# Patient Record
Sex: Female | Born: 1980
Health system: Southern US, Community
[De-identification: ages and names within clinical notes are randomized; demographics above are authoritative.]

## PROBLEM LIST (undated history)

## (undated) DIAGNOSIS — F32A Depression, unspecified: Secondary | ICD-10-CM

## (undated) DIAGNOSIS — F988 Other specified behavioral and emotional disorders with onset usually occurring in childhood and adolescence: Secondary | ICD-10-CM

## (undated) DIAGNOSIS — F329 Major depressive disorder, single episode, unspecified: Secondary | ICD-10-CM

## (undated) DIAGNOSIS — F419 Anxiety disorder, unspecified: Secondary | ICD-10-CM

## (undated) DIAGNOSIS — F1010 Alcohol abuse, uncomplicated: Secondary | ICD-10-CM

## (undated) DIAGNOSIS — D649 Anemia, unspecified: Secondary | ICD-10-CM

## (undated) DIAGNOSIS — F3281 Premenstrual dysphoric disorder: Secondary | ICD-10-CM

## (undated) HISTORY — DX: Major depressive disorder, single episode, unspecified: F32.9

## (undated) HISTORY — DX: Depression, unspecified: F32.A

## (undated) HISTORY — DX: Anxiety disorder, unspecified: F41.9

## (undated) HISTORY — PX: KNEE SURGERY: SHX244

## (undated) HISTORY — DX: Anemia, unspecified: D64.9

---
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
## 2000-01-19 ENCOUNTER — Encounter: Admission: RE | Admit: 2000-01-19 | Discharge: 2000-01-19 | Payer: Self-pay | Admitting: Family Medicine

## 2002-02-22 ENCOUNTER — Ambulatory Visit (HOSPITAL_COMMUNITY): Admission: RE | Admit: 2002-02-22 | Discharge: 2002-02-22 | Payer: Self-pay | Admitting: Family Medicine

## 2002-02-22 ENCOUNTER — Encounter: Payer: Self-pay | Admitting: Family Medicine

## 2003-09-10 ENCOUNTER — Encounter: Admission: RE | Admit: 2003-09-10 | Discharge: 2003-09-10 | Payer: Self-pay | Admitting: Psychiatry

## 2004-12-28 ENCOUNTER — Other Ambulatory Visit: Admission: RE | Admit: 2004-12-28 | Discharge: 2004-12-28 | Payer: Self-pay | Admitting: Obstetrics and Gynecology

## 2005-05-27 ENCOUNTER — Ambulatory Visit: Payer: Self-pay | Admitting: Psychiatry

## 2005-05-27 ENCOUNTER — Ambulatory Visit (HOSPITAL_COMMUNITY): Payer: Self-pay | Admitting: Psychiatry

## 2005-10-17 ENCOUNTER — Ambulatory Visit (HOSPITAL_COMMUNITY): Payer: Self-pay | Admitting: Psychiatry

## 2005-12-08 ENCOUNTER — Ambulatory Visit (HOSPITAL_COMMUNITY): Payer: Self-pay | Admitting: Psychiatry

## 2005-12-08 ENCOUNTER — Other Ambulatory Visit: Admission: RE | Admit: 2005-12-08 | Discharge: 2005-12-08 | Payer: Self-pay | Admitting: Obstetrics and Gynecology

## 2006-03-06 ENCOUNTER — Ambulatory Visit (HOSPITAL_COMMUNITY): Payer: Self-pay | Admitting: Psychiatry

## 2006-05-19 ENCOUNTER — Ambulatory Visit (HOSPITAL_COMMUNITY): Payer: Self-pay | Admitting: Psychiatry

## 2006-07-27 ENCOUNTER — Ambulatory Visit (HOSPITAL_COMMUNITY): Payer: Self-pay | Admitting: Psychiatry

## 2006-09-29 ENCOUNTER — Ambulatory Visit (HOSPITAL_COMMUNITY): Payer: Self-pay | Admitting: Psychiatry

## 2006-12-26 ENCOUNTER — Ambulatory Visit (HOSPITAL_COMMUNITY): Payer: Self-pay | Admitting: Psychiatry

## 2007-03-06 ENCOUNTER — Ambulatory Visit (HOSPITAL_COMMUNITY): Payer: Self-pay | Admitting: Psychiatry

## 2007-05-29 ENCOUNTER — Ambulatory Visit (HOSPITAL_COMMUNITY): Payer: Self-pay | Admitting: Psychiatry

## 2007-08-06 ENCOUNTER — Ambulatory Visit (HOSPITAL_COMMUNITY): Payer: Self-pay | Admitting: Psychiatry

## 2007-10-02 ENCOUNTER — Ambulatory Visit (HOSPITAL_COMMUNITY): Payer: Self-pay | Admitting: Psychiatry

## 2007-12-27 ENCOUNTER — Ambulatory Visit (HOSPITAL_COMMUNITY): Payer: Self-pay | Admitting: Psychiatry

## 2008-03-04 ENCOUNTER — Ambulatory Visit (HOSPITAL_COMMUNITY): Payer: Self-pay | Admitting: Psychiatry

## 2008-04-28 ENCOUNTER — Ambulatory Visit (HOSPITAL_COMMUNITY): Payer: Self-pay | Admitting: Psychiatry

## 2008-12-10 ENCOUNTER — Ambulatory Visit (HOSPITAL_COMMUNITY): Payer: Self-pay | Admitting: Psychiatry

## 2009-03-06 ENCOUNTER — Ambulatory Visit (HOSPITAL_COMMUNITY): Payer: Self-pay | Admitting: Psychiatry

## 2009-05-06 ENCOUNTER — Ambulatory Visit (HOSPITAL_COMMUNITY): Payer: Self-pay | Admitting: Psychiatry

## 2013-05-07 ENCOUNTER — Ambulatory Visit (INDEPENDENT_AMBULATORY_CARE_PROVIDER_SITE_OTHER): Payer: BC Managed Care – PPO | Admitting: Internal Medicine

## 2013-05-07 VITALS — BP 124/76 | HR 120 | Temp 99.0°F | Resp 16 | Ht 62.5 in | Wt 122.0 lb

## 2013-05-07 DIAGNOSIS — F341 Dysthymic disorder: Secondary | ICD-10-CM

## 2013-05-07 DIAGNOSIS — F988 Other specified behavioral and emotional disorders with onset usually occurring in childhood and adolescence: Secondary | ICD-10-CM

## 2013-05-07 DIAGNOSIS — F329 Major depressive disorder, single episode, unspecified: Secondary | ICD-10-CM

## 2013-05-07 MED ORDER — AMPHETAMINE-DEXTROAMPHETAMINE 30 MG PO TABS: 30.0000 mg | ORAL_TABLET | Freq: Two times a day (BID) | ORAL | Status: DC

## 2013-05-07 MED ORDER — BUPROPION HCL ER (XL) 300 MG PO TB24: 300.0000 mg | ORAL_TABLET | Freq: Every day | ORAL | Status: DC

## 2013-05-08 ENCOUNTER — Encounter: Payer: Self-pay | Admitting: Internal Medicine

## 2013-05-08 DIAGNOSIS — F329 Major depressive disorder, single episode, unspecified: Secondary | ICD-10-CM | POA: Insufficient documentation

## 2013-05-08 DIAGNOSIS — F902 Attention-deficit hyperactivity disorder, combined type: Secondary | ICD-10-CM | POA: Insufficient documentation

## 2013-05-08 NOTE — Progress Notes (Signed)
  Subjective:    Patient ID: Taylor [name], female    DOB: 1981/03/26, 32 y.o.   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 Spending the rainy season here Problem #1 attention deficit disorder  Does well with Adderall 30 mg in morning and 20 mg in the afternoon for a long time Problem #2 reactive depression following a significant relationship breakup 3-4 years ago  Breast well to Wellbutrin and is reluctant to discontinue it Problem #3 insomnia-occasional Xanax very rare because doing so well  pmh= 4 knee surgeries on the same knee for sports-related problems  Yaz  Parents divorce  FH 2 sibs    Review of Systems All stable except recent episode of fever and diarrhea with nausea and vomiting before she left [place]/this has improved and she feels like she is getting well although still has loose stools Rest of review systems is negative    Objective:   Physical Exam BP 124/76  Pulse 120  Temp(Src) 99 F (37.2 C) (Oral)  Resp 16  Ht 5' 2.5" (1.588 m)  Wt 122 lb (55.339 kg)  BMI 21.94 kg/m2  SpO2 98% Exam is normal Mood good/affect appropriate even unbelievable for what she faces in [place]      Assessment & Plan:  Problem #1 attention deficit disorder Problem 2 reactive depression  Meds ordered this encounter  Medications  . amphetamine-dextroamphetamine (ADDERALL) 30 MG tablet    Sig: Take 1 tablet (30 mg total) by mouth 2 (two) times daily.    Dispense:  60 tablet    Refill:  0  . amphetamine-dextroamphetamine (ADDERALL) 30 MG tablet    Sig: Take 1 tablet (30 mg total) by mouth 2 (two) times daily. 06/07/13    Dispense:  60 tablet    Refill:  0  . amphetamine-dextroamphetamine (ADDERALL) 30 MG tablet    Sig: Take 1 tablet (30 mg total) by mouth 2 (two) times daily. 07/07/13    Dispense:  60  tablet    Refill:  0  . buPROPion (WELLBUTRIN XL) 300 MG 24 hr tablet    Sig: Take 1 tablet (300 mg total) by mouth daily.    Dispense:  90 tablet    Refill:  3   She will call over the next 10 days if diarrhea persists and if she develops fever again I would suggest impaired treatment with Flagyl first and then stool testing only if not responsive  She may call in 3 months for refills and followup in 6 months We may have to rearrange her scheduling due to international commitments  May call she needs refills of Xanax for insomnia, but is currently trying to discontinue this

## 2013-07-31 ENCOUNTER — Encounter: Payer: Self-pay | Admitting: Internal Medicine

## 2013-08-02 MED ORDER — BUPROPION HCL ER (XL) 300 MG PO TB24: 300.0000 mg | ORAL_TABLET | Freq: Every day | ORAL | Status: DC

## 2013-08-02 MED ORDER — AMPHETAMINE-DEXTROAMPHETAMINE 30 MG PO TABS: 30.0000 mg | ORAL_TABLET | Freq: Two times a day (BID) | ORAL | Status: DC

## 2013-08-02 NOTE — Telephone Encounter (Signed)
  Meds ordered this encounter  Medications  . amphetamine-dextroamphetamine (ADDERALL) 30 MG tablet    Sig: Take 1 tablet (30 mg total) by mouth 2 (two) times daily. 07/07/13    Dispense:  60 tablet    Refill:  0  . amphetamine-dextroamphetamine (ADDERALL) 30 MG tablet    Sig: Take 1 tablet (30 mg total) by mouth 2 (two) times daily.    Dispense:  60 tablet    Refill:  0  . amphetamine-dextroamphetamine (ADDERALL) 30 MG tablet    Sig: Take 1 tablet (30 mg total) by mouth 2 (two) times daily. 06/07/13    Dispense:  60 tablet    Refill:  0  . buPROPion (WELLBUTRIN XL) 300 MG 24 hr tablet    Sig: Take 1 tablet (300 mg total) by mouth daily.    Dispense:  90 tablet    Refill:  3  Will change dates

## 2013-10-15 ENCOUNTER — Ambulatory Visit (INDEPENDENT_AMBULATORY_CARE_PROVIDER_SITE_OTHER): Payer: BC Managed Care – PPO | Admitting: Internal Medicine

## 2013-10-15 VITALS — BP 122/90 | HR 133 | Temp 98.5°F | Resp 18 | Ht 62.0 in | Wt 112.4 lb

## 2013-10-15 DIAGNOSIS — F988 Other specified behavioral and emotional disorders with onset usually occurring in childhood and adolescence: Secondary | ICD-10-CM

## 2013-10-15 DIAGNOSIS — F3281 Premenstrual dysphoric disorder: Secondary | ICD-10-CM

## 2013-10-15 DIAGNOSIS — N943 Premenstrual tension syndrome: Secondary | ICD-10-CM

## 2013-10-15 MED ORDER — DROSPIRENONE-ETHINYL ESTRADIOL 3-0.02 MG PO TABS: 1.0000 | ORAL_TABLET | Freq: Every day | ORAL | Status: DC

## 2013-10-15 MED ORDER — AMPHETAMINE-DEXTROAMPHETAMINE 30 MG PO TABS: 30.0000 mg | ORAL_TABLET | Freq: Two times a day (BID) | ORAL | Status: DC

## 2013-10-15 NOTE — Progress Notes (Signed)
This chart was scribed for Tonye Pearson, MD by Caryn Bee, Medical Scribe. This patient was seen in Room/bed 3 and the patient's care was started at 2:24 PM.  Subjective:    Patient ID: Taylor [name], female    DOB: 1981-12-05, 32 y.o.   MRN: [id number]  HPI HPI Comments: Taylor [name] is a 32 y.o. female who presents to [facility] for prescription 30 mg Adderall refill.  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  Pt will also need refill of Yaz. She takes it continuously for PMDD and has periods 2- 4 times per year.   Review of Systems Past Medical History  Diagnosis Date  . Anxiety   . Anemia   . Depression         Social History Main Topics  . Smoking status: Never Smoker   . Smokeless tobacco: Not on file  . Alcohol Use: 3.0 oz/week  . Drug Use: No  . Sexual Activity: Yes    Birth Control/ Protection: Pill       Objective:   Physical Exam  Nursing note and vitals reviewed. Constitutional: She is oriented to person, place, and time. She appears well-developed and well-nourished. No distress.  HENT:  Head: Normocephalic and atraumatic.  Eyes: Pupils are equal, round, and reactive to light.  Neck: Normal range of motion.  Cardiovascular: Normal rate and regular rhythm.   Pulmonary/Chest: Effort normal. No respiratory distress.  Musculoskeletal: Normal range of motion.  Neurological: She is alert and oriented to person, place, and time.  Skin: Skin is warm and dry.  Psychiatric: She has a normal mood and affect. Her behavior is normal.  BP 122/90  Pulse 133  Temp(Src) 98.5 F (36.9 C) (Oral)  Resp 18  Ht 5\' 2"  (1.575 m)  Wt 112 lb 6.4 oz (50.984 kg)  BMI 20.55 kg/m2  SpO2 100%     Assessment & Plan:  Attention deficit disorder Reactive depression PMDD  Meds ordered this encounter  Medications  . amphetamine-dextroamphetamine (ADDERALL) 30 MG tablet    Sig: Take 1 tablet (30 mg total) by  mouth 2 (two) times daily. 06/07/13    Dispense:  60 tablet    Refill:  0  . amphetamine-dextroamphetamine (ADDERALL) 30 MG tablet    Sig: Take 1 tablet (30 mg total) by mouth 2 (two) times daily. 07/07/13    Dispense:  60 tablet    Refill:  0  . amphetamine-dextroamphetamine (ADDERALL) 30 MG tablet    Sig: Take 1 tablet (30 mg total) by mouth 2 (two) times daily.    Dispense:  60 tablet    Refill:  0  . drospirenone-ethinyl estradiol (YAZ,GIANVI,LORYNA) 3-0.02 MG tablet    Sig: Take 1 tablet by mouth daily. Take active pills continuously for 6 months before allowing menses    Dispense:  3 Package    Refill:  3

## 2013-10-16 DIAGNOSIS — F3281 Premenstrual dysphoric disorder: Secondary | ICD-10-CM | POA: Insufficient documentation

## 2013-10-24 ENCOUNTER — Other Ambulatory Visit: Payer: Self-pay

## 2013-11-07 ENCOUNTER — Telehealth: Payer: Self-pay

## 2013-11-07 NOTE — Telephone Encounter (Signed)
Patient lost her Adderrall scripts. They are written out for 3 months. Can she have those re-printed?  [phone]

## 2013-11-08 MED ORDER — AMPHETAMINE-DEXTROAMPHETAMINE 30 MG PO TABS: 30.0000 mg | ORAL_TABLET | Freq: Two times a day (BID) | ORAL | Status: DC

## 2013-12-10 ENCOUNTER — Telehealth: Payer: Self-pay | Admitting: Radiology

## 2013-12-10 NOTE — Telephone Encounter (Signed)
Patient lost her Rx's in written in October for November / December, these were replaced, placed in pick up drawer. Now they are not in pick up drawer, patient states she did not get them, will you rewrite, again? We may need to check data base first.

## 2013-12-11 MED ORDER — AMPHETAMINE-DEXTROAMPHETAMINE 30 MG PO TABS: 30.0000 mg | ORAL_TABLET | Freq: Two times a day (BID) | ORAL | Status: DC

## 2013-12-11 NOTE — Telephone Encounter (Signed)
To repl lost paper Meds ordered this encounter  Medications  . amphetamine-dextroamphetamine (ADDERALL) 30 MG tablet    Sig: Take 1 tablet (30 mg total) by mouth 2 (two) times daily.    Dispense:  60 tablet    Refill:  0  . amphetamine-dextroamphetamine (ADDERALL) 30 MG tablet    Sig: Take 1 tablet (30 mg total) by mouth 2 (two) times daily. For 30 days after signing date    Dispense:  60 tablet    Refill:  0

## 2013-12-11 NOTE — Telephone Encounter (Signed)
Pt.notified

## 2014-01-28 ENCOUNTER — Encounter: Payer: Self-pay | Admitting: Internal Medicine

## 2014-01-29 MED ORDER — AMPHETAMINE-DEXTROAMPHETAMINE 30 MG PO TABS: 30.0000 mg | ORAL_TABLET | Freq: Two times a day (BID) | ORAL | Status: DC

## 2014-01-29 NOTE — Telephone Encounter (Signed)
Meds ordered this encounter  Medications  . amphetamine-dextroamphetamine (ADDERALL) 30 MG tablet    Sig: Take 1 tablet (30 mg total) by mouth 2 (two) times daily. For 30 days after signing date    Dispense:  60 tablet    Refill:  0  . amphetamine-dextroamphetamine (ADDERALL) 30 MG tablet    Sig: Take 1 tablet (30 mg total) by mouth 2 (two) times daily.    Dispense:  60 tablet    Refill:  0  . amphetamine-dextroamphetamine (ADDERALL) 30 MG tablet    Sig: Take 1 tablet (30 mg total) by mouth 2 (two) times daily. For 60 days after signing date    Dispense:  60 tablet    Refill:  0

## 2014-03-01 ENCOUNTER — Encounter: Payer: Self-pay | Admitting: Internal Medicine

## 2014-03-01 NOTE — Telephone Encounter (Signed)
I don't see any telephone messages or anything about this. Have you heard anything about her dropping this off?

## 2014-03-05 NOTE — Telephone Encounter (Signed)
i haven't heard anything--this should be easy to complete

## 2014-03-20 ENCOUNTER — Other Ambulatory Visit: Payer: Self-pay

## 2014-03-20 MED ORDER — AMPHETAMINE-DEXTROAMPHETAMINE 30 MG PO TABS: 30.0000 mg | ORAL_TABLET | Freq: Two times a day (BID) | ORAL | Status: DC

## 2014-03-20 NOTE — Telephone Encounter (Signed)
I called pt to check to see if this Rx had ever been rewritten. EPIC notes showed Taylor [name] had sent message to PA pool, but I didn't see any new Rx printed or any record in Log Book that pt had p/up. Pt reported that she had been out of town and just got back a couple of days ago. She has not gotten a new copy of Rx orig written for "30 days after day written" but would appreciate one. She does still have the one for 60 days after, but can't get it filled yet. I never saw the orig that was dropped off here so not sure if it was put in Dr Doolittle's box?

## 2014-03-21 NOTE — Telephone Encounter (Signed)
Notified pt Rx ready for p/up and transferred to 104 to sch appt.

## 2014-04-21 ENCOUNTER — Ambulatory Visit (INDEPENDENT_AMBULATORY_CARE_PROVIDER_SITE_OTHER): Payer: BC Managed Care – PPO | Admitting: Internal Medicine

## 2014-04-21 VITALS — BP 124/76 | HR 104 | Temp 99.0°F | Resp 17 | Ht 63.0 in | Wt 122.0 lb

## 2014-04-21 DIAGNOSIS — F988 Other specified behavioral and emotional disorders with onset usually occurring in childhood and adolescence: Secondary | ICD-10-CM

## 2014-04-21 MED ORDER — AMPHETAMINE-DEXTROAMPHETAMINE 30 MG PO TABS: 30.0000 mg | ORAL_TABLET | Freq: Two times a day (BID) | ORAL | Status: DC

## 2014-04-21 NOTE — Progress Notes (Signed)
Patient Active Problem List   Diagnosis Date Noted  . PMDD (premenstrual dysphoric disorder) 10/16/2013  . ADD (attention deficit disorder) 05/08/2013  . 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 amphetamine-dextroamphetamine (ADDERALL) 30 MG tablet    Sig: Take 1 tablet (30 mg total) by mouth 2 (two) times daily. Needs 3 month supply for being in [place] for next 3 months    Dispense:  180 tablet    Refill:  0   Recheck in August Consider halcion if medications needed for waking early and being unable to sleep

## 2014-05-28 ENCOUNTER — Telehealth: Payer: Self-pay

## 2014-05-28 MED ORDER — BUPROPION HCL ER (XL) 300 MG PO TB24: 300.0000 mg | ORAL_TABLET | Freq: Every day | ORAL | Status: DC

## 2014-05-28 MED ORDER — DROSPIRENONE-ETHINYL ESTRADIOL 3-0.02 MG PO TABS: 1.0000 | ORAL_TABLET | Freq: Every day | ORAL | Status: DC

## 2014-05-28 NOTE — Telephone Encounter (Signed)
Lm for mother- wellbutrin and [facility] sent to the pharmacy.  We have to wait for authorization from Dr. Merla Riches for the Adderall.

## 2014-05-28 NOTE — Telephone Encounter (Signed)
Can this be approved for the patient? She needs enough to last through the end of August. 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

## 2014-05-28 NOTE — Telephone Encounter (Signed)
Wellbutrin and OCP sent to pharmacy.  Please ask a physician to write Rx for Adderall.  I cannot write for more than 30 days which will not be sufficient as she does not return until August

## 2014-05-28 NOTE — Telephone Encounter (Signed)
Please forward this to Dr. Merla Riches. This is his regular patient we certainly can go ahead and refill the Wellbutrin and also the birth control pills. We need to get approval from Dr. Merla Riches regarding Adderall
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
## 2014-05-28 NOTE — Telephone Encounter (Signed)
Taylor [name]'s mother called in and would like Dr. Merla Riches to contact her about her daughter who is in [place]. She can be reached at [phone] Thank you

## 2014-05-29 NOTE — Telephone Encounter (Signed)
Please call pt when completed.

## 2014-05-29 NOTE — Telephone Encounter (Signed)
Ok to give 3 month supply---I can do this Friday am

## 2014-05-30 MED ORDER — AMPHETAMINE-DEXTROAMPHETAMINE 30 MG PO TABS: 30.0000 mg | ORAL_TABLET | Freq: Two times a day (BID) | ORAL | Status: DC

## 2014-05-30 NOTE — Telephone Encounter (Signed)
LMOM that rx is ready for p/u 
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
## 2014-06-03 ENCOUNTER — Other Ambulatory Visit: Payer: Self-pay | Admitting: *Deleted

## 2014-06-03 MED ORDER — BUPROPION HCL ER (XL) 300 MG PO TB24: 300.0000 mg | ORAL_TABLET | Freq: Every day | ORAL | Status: DC

## 2014-07-28 ENCOUNTER — Encounter: Payer: Self-pay | Admitting: Internal Medicine

## 2014-07-29 MED ORDER — AMPHETAMINE-DEXTROAMPHETAMINE 30 MG PO TABS: 30.0000 mg | ORAL_TABLET | Freq: Two times a day (BID) | ORAL | Status: DC

## 2014-07-29 NOTE — Telephone Encounter (Signed)
Meds ordered this encounter  Medications  . amphetamine-dextroamphetamine (ADDERALL) 30 MG tablet    Sig: Take 1 tablet (30 mg total) by mouth 2 (two) times daily.    Dispense:  60 tablet    Refill:  0  . amphetamine-dextroamphetamine (ADDERALL) 30 MG tablet    Sig: Take 1 tablet (30 mg total) by mouth 2 (two) times daily. For 60 days after signing date    Dispense:  60 tablet    Refill:  0  . amphetamine-dextroamphetamine (ADDERALL) 30 MG tablet    Sig: Take 1 tablet (30 mg total) by mouth 2 (two) times daily. For 30 days after signing date    Dispense:  60 tablet    Refill:  0

## 2014-09-01 ENCOUNTER — Telehealth: Payer: Self-pay

## 2014-09-01 MED ORDER — AMPHETAMINE-DEXTROAMPHETAMINE 30 MG PO TABS: 30.0000 mg | ORAL_TABLET | Freq: Two times a day (BID) | ORAL | Status: DC

## 2014-09-01 NOTE — Telephone Encounter (Signed)
I asked Debbie to address this since Chelle was w/a pt. Printed and taken to [employer] for signature.

## 2014-09-01 NOTE — Telephone Encounter (Signed)
Debbie signed Rxs and I called the 5 day emerg supply in and mailed Rxs.

## 2014-09-01 NOTE — Telephone Encounter (Signed)
Pt called from pharm in [place] stating that the script for Adderall written for 30 days after signing date is expired bc d/t [place] laws, class II controlled always expires 30 days after written date. She stated that we can mail a new script to them along with a separate script for an emergency 5 day supply. She can then take a verbal order for the 5 day supply and fill now while waiting on the written scripts. Chelle, may I do this in your name? Pended.

## 2014-09-05 ENCOUNTER — Other Ambulatory Visit: Payer: Self-pay | Admitting: *Deleted

## 2014-09-05 MED ORDER — BUPROPION HCL ER (XL) 300 MG PO TB24: ORAL_TABLET | ORAL | Status: DC

## 2014-09-05 NOTE — Telephone Encounter (Signed)
Received fax for refill request.  Pt needs OV. Sent #[phone].

## 2014-09-06 NOTE — Telephone Encounter (Signed)
The patient called and said that she spoke to the pharmacy about her Adderall prescription that was mailed from our office on 09/01/14.  The pharmacy denies receiving the prescription, so the patient said that she had to fill an emergency one-week refill with the pharmacy in the mean time.  She cannot get another emergency refill, and she said that she is completely out of her medication.  She is requesting that we resend the prescription through mail directly to her personal address.  The patient's current address is: [address] [place], [place] [address].  The patient requested that someone call her once it is mailed, so she knows that she should be looking out for it in the mail.  CB#: [phone].  Please advise.  Thank you.

## 2014-09-08 NOTE — Telephone Encounter (Signed)
I tried to call her and didn't get an answer. The prescription was mailed per [employer] [facility] It can take awhile for the mail to arrive because it is processed through [facility]. Please call her and see if she received her prescription. At this point, I don't think it would be helpful to send another.

## 2014-09-08 NOTE — Telephone Encounter (Signed)
Please advise 

## 2014-09-09 NOTE — Telephone Encounter (Signed)
Lm for pt to advise mail process can take a few weeks- asked her to rtn call.

## 2014-09-12 NOTE — Telephone Encounter (Signed)
Taylor Mccreedy do you happen to remember the address you mailed the rx's to? Pt is in [place]. Notice the address was not called to [place] until 5 days after it states it was mailed. Did this go to the pharmacy or her home address in [place]?

## 2014-09-12 NOTE — Telephone Encounter (Signed)
Rx was mailed to pt's pharmacy in [place] as inst'd by pharmacist. I advised pharmacist that Rx would take a while to get there since had to go through [facility].

## 2014-09-15 NOTE — Telephone Encounter (Signed)
Lm to advise pt it was mailed to her in [place]

## 2014-10-06 ENCOUNTER — Telehealth: Payer: Self-pay

## 2014-10-06 NOTE — Telephone Encounter (Signed)
Pt is needing refill on amphetamine-dextroamphetamine (ADDERALL) 30 MG tablet. Pt  Is requesting that it be mailed to [place]; [address] [place] [id number]. She had us do this for her previously. Please advise pt when script has been mailed.

## 2014-10-08 MED ORDER — AMPHETAMINE-DEXTROAMPHETAMINE 30 MG PO TABS: 30.0000 mg | ORAL_TABLET | Freq: Two times a day (BID) | ORAL | Status: DC

## 2014-10-08 NOTE — Telephone Encounter (Signed)
Meds ordered this encounter  Medications  . amphetamine-dextroamphetamine (ADDERALL) 30 MG tablet    Sig: Take 1 tablet by mouth 2 (two) times daily.    Dispense:  60 tablet    Refill:  0   Needs f/u in next month

## 2014-10-08 NOTE — Telephone Encounter (Signed)
Mailed, LMOM letting pt know.

## 2014-10-29 ENCOUNTER — Other Ambulatory Visit: Payer: Self-pay | Admitting: General Practice

## 2014-10-29 ENCOUNTER — Encounter: Payer: Self-pay | Admitting: Internal Medicine

## 2014-10-31 MED ORDER — AMPHETAMINE-DEXTROAMPHETAMINE 30 MG PO TABS: 30.0000 mg | ORAL_TABLET | Freq: Two times a day (BID) | ORAL | Status: DC

## 2014-10-31 MED ORDER — DROSPIRENONE-ETHINYL ESTRADIOL 3-0.02 MG PO TABS: 1.0000 | ORAL_TABLET | Freq: Every day | ORAL | Status: DC

## 2014-10-31 NOTE — Telephone Encounter (Signed)
Adderall printed and put in outgoing mail box for Mon pick up. Notified pt on MyChart.

## 2014-10-31 NOTE — Addendum Note (Signed)
Addended by: Sheppard PlumberBRIGGS, Hiro Vipond A on: 10/31/2014 04:37 PM   Modules accepted: Orders

## 2014-10-31 NOTE — Telephone Encounter (Signed)
Spoke to pt and verified her address and correct pharmacy info. Sent in her RFs of Yaz. Pending adderall Rxs to mail to her at address in this message.

## 2014-11-03 NOTE — Progress Notes (Signed)
I got this in mail on Friday, to go out Monday. Pt aware.

## 2014-12-14 ENCOUNTER — Ambulatory Visit (INDEPENDENT_AMBULATORY_CARE_PROVIDER_SITE_OTHER): Payer: Self-pay | Admitting: Internal Medicine

## 2014-12-14 VITALS — BP 128/80 | HR 102 | Temp 98.1°F | Resp 16 | Ht 63.0 in | Wt 117.4 lb

## 2014-12-14 DIAGNOSIS — F3281 Premenstrual dysphoric disorder: Secondary | ICD-10-CM

## 2014-12-14 DIAGNOSIS — F329 Major depressive disorder, single episode, unspecified: Secondary | ICD-10-CM

## 2014-12-14 DIAGNOSIS — F988 Other specified behavioral and emotional disorders with onset usually occurring in childhood and adolescence: Secondary | ICD-10-CM

## 2014-12-14 DIAGNOSIS — F341 Dysthymic disorder: Secondary | ICD-10-CM

## 2014-12-14 DIAGNOSIS — F909 Attention-deficit hyperactivity disorder, unspecified type: Secondary | ICD-10-CM

## 2014-12-14 DIAGNOSIS — N943 Premenstrual tension syndrome: Secondary | ICD-10-CM

## 2014-12-14 MED ORDER — AMPHETAMINE-DEXTROAMPHETAMINE 30 MG PO TABS: 30.0000 mg | ORAL_TABLET | Freq: Two times a day (BID) | ORAL | Status: DC

## 2014-12-14 MED ORDER — BUPROPION HCL ER (XL) 300 MG PO TB24: ORAL_TABLET | ORAL | Status: DC

## 2014-12-14 MED ORDER — DROSPIRENONE-ETHINYL ESTRADIOL 3-0.02 MG PO TABS: 1.0000 | ORAL_TABLET | Freq: Every day | ORAL | Status: DC

## 2014-12-14 NOTE — Progress Notes (Signed)
   Subjective:    Patient ID: Taylor [name], female    DOB: 01-Jun-1981, 33 y.o.   MRN: [id number]  HPIf/u Patient Active Problem List   Diagnosis Date Noted  . PMDD (premenstrual dysphoric disorder)--- controlled by 6 months consecutive oral contraceptives  10/16/2013  . ADD (attention deficit disorder)--- continues to do well at current dose of Adderall without side effects or problems  05/08/2013  . Depression, reactive--- very good response Wellbutrin  05/08/2013    -  GERD--- seldom has trouble anymore and if so responds easily to Maalox  History of sensitive stomach in general.  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 Home visiting her family. Describes no problems within the family structure at this point even though parents are divorced.  No current relationship/not currently sexually active.   Review of Systems No weight loss No fevers or night sweats No cough or shortness of breath No palpitations No GYN problems    Objective:   Physical Exam  Constitutional: She is oriented to person, place, and time. She appears well-developed and well-nourished. No distress.  HENT:  Head: Normocephalic and atraumatic.  Eyes: Pupils are equal, round, and reactive to light.  Neck: Normal range of motion.  Cardiovascular: Normal rate and regular rhythm.   Pulmonary/Chest: Effort normal. No respiratory distress.  Musculoskeletal: Normal range of motion.  Neurological: She is alert and oriented to person, place, and time.  Skin: Skin is warm and dry.  Psychiatric: She has a normal mood and affect. Her behavior is normal.  Nursing note and vitals reviewed.  BP 128/80 mmHg  Pulse 102  Temp(Src) 98.1 F (36.7 C) (Oral)  Resp 16  Ht 5\' 3"  (1.6 m)  Wt 117 lb 6.4 oz (53.252 kg)  BMI 20.80 kg/m2  SpO2 99%        Assessment &  Plan:  ADD (attention deficit disorder)  Depression, reactive  PMDD (premenstrual dysphoric disorder)  Meds ordered this encounter  Medications  . buPROPion (WELLBUTRIN XL) 300 MG 24 hr tablet    Sig: Take one tablet daily    Dispense:  90 tablet    Refill:  3  . drospirenone-ethinyl estradiol (YAZ,GIANVI,LORYNA) 3-0.02 MG tablet    Sig: Take 1 tablet by mouth daily. Take active pills continuously for 6 months before allowing menses    Dispense:  3 Package    Refill:  3  . amphetamine-dextroamphetamine (ADDERALL) 30 MG tablet    Sig: Take 1 tablet by mouth 2 (two) times daily. For 60 days after signing date    Dispense:  60 tablet    Refill:  0  . amphetamine-dextroamphetamine (ADDERALL) 30 MG tablet    Sig: Take 1 tablet by mouth 2 (two) times daily.    Dispense:  60 tablet    Refill:  0  . amphetamine-dextroamphetamine (ADDERALL) 30 MG tablet    Sig: Take 1 tablet by mouth 2 (two) times daily. For 30 days after signing date    Dispense:  60 tablet    Refill:  0   Call in 3 months in follow-up in 6 months She will most likely have to fill her medications in [place] as [place] has stringent pharmacy laws regarding control substances

## 2015-01-21 ENCOUNTER — Encounter: Payer: Self-pay | Admitting: Internal Medicine

## 2015-01-21 MED ORDER — AMPHETAMINE-DEXTROAMPHETAMINE 30 MG PO TABS: 30.0000 mg | ORAL_TABLET | Freq: Two times a day (BID) | ORAL | Status: DC

## 2015-02-28 ENCOUNTER — Encounter: Payer: Self-pay | Admitting: Internal Medicine

## 2015-03-10 ENCOUNTER — Telehealth: Payer: Self-pay

## 2015-03-10 MED ORDER — AMPHETAMINE-DEXTROAMPHETAMINE 30 MG PO TABS: 30.0000 mg | ORAL_TABLET | Freq: Two times a day (BID) | ORAL | Status: DC

## 2015-03-10 NOTE — Telephone Encounter (Signed)
Dr. Merla Richesoolittle,  Pt states that she left you a message on MyChart regarding her RX refill. She is in [place] and needs a RX for 3 months worth of adderall mailed to her. The address is: [address] [place], [place] [id number].

## 2015-03-10 NOTE — Telephone Encounter (Signed)
Dr. Merla Richesoolittle,  Pt states that she left you a message on MyChart regarding her RX refill. She is in [place] and needs a RX for 3 months worth of adderall mailed to her. The address is: [address] [place], [place] [id number].         Meds ordered this encounter  Medications  . amphetamine-dextroamphetamine (ADDERALL) 30 MG tablet    Sig: Take 1 tablet by mouth 2 (two) times daily. For 60 days after signing date    Dispense:  60 tablet    Refill:  0  . amphetamine-dextroamphetamine (ADDERALL) 30 MG tablet    Sig: Take 1 tablet by mouth 2 (two) times daily. For 30 days or after from the Date of Signing    Dispense:  60 tablet    Refill:  0  . amphetamine-dextroamphetamine (ADDERALL) 30 MG tablet    Sig: Take 1 tablet by mouth 2 (two) times daily.    Dispense:  60 tablet    Refill:  0

## 2015-03-11 NOTE — Telephone Encounter (Signed)
Rx mailed. Pt notified

## 2015-04-06 ENCOUNTER — Other Ambulatory Visit: Payer: Self-pay | Admitting: Internal Medicine

## 2015-04-06 ENCOUNTER — Telehealth: Payer: Self-pay

## 2015-04-06 MED ORDER — AMPHETAMINE-DEXTROAMPHETAMINE 30 MG PO TABS: 30.0000 mg | ORAL_TABLET | Freq: Two times a day (BID) | ORAL | Status: DC

## 2015-04-06 NOTE — Telephone Encounter (Signed)
I checked the return mail but I remember mailing this. What other options do we have?

## 2015-04-06 NOTE — Telephone Encounter (Signed)
Mail again---?track this time?

## 2015-04-06 NOTE — Progress Notes (Signed)
See phone message apparently the female did not get her her last 3 prescriptions written on 03/10/2015 to cover 3 months  We will try to mail to prescriptions to replace these Meds ordered this encounter  Medications  . amphetamine-dextroamphetamine (ADDERALL) 30 MG tablet    Sig: Take 1 tablet by mouth 2 (two) times daily.    Dispense:  60 tablet    Refill:  0  . amphetamine-dextroamphetamine (ADDERALL) 30 MG tablet    Sig: Take 1 tablet by mouth 2 (two) times daily. For 30 days or after from the Date of Signing    Dispense:  60 tablet    Refill:  0

## 2015-04-06 NOTE — Telephone Encounter (Signed)
Pt states she still hasn't received her ADDERALL 30 MG. Wanted to make sure it is [address] [place]  [id number] Please call pt at [phone] if questions

## 2015-04-06 NOTE — Telephone Encounter (Signed)
Spoke with pt, mailed Rx's to her house through certified mail. Gave envelope to Merchant[place]Julie.

## 2015-05-21 ENCOUNTER — Encounter: Payer: Self-pay | Admitting: Internal Medicine

## 2015-05-27 ENCOUNTER — Encounter: Payer: Self-pay | Admitting: Internal Medicine

## 2015-06-02 ENCOUNTER — Encounter: Payer: Self-pay | Admitting: Internal Medicine

## 2015-06-02 DIAGNOSIS — F988 Other specified behavioral and emotional disorders with onset usually occurring in childhood and adolescence: Secondary | ICD-10-CM

## 2015-06-04 NOTE — Telephone Encounter (Signed)
We just received the Rxs printed on 03/10/15 back in the mail, and they were marked undeliverable. I have shredded these.

## 2015-06-24 ENCOUNTER — Other Ambulatory Visit: Payer: Self-pay | Admitting: Internal Medicine

## 2015-06-30 ENCOUNTER — Telehealth: Payer: Self-pay | Admitting: *Deleted

## 2015-06-30 MED ORDER — AMPHETAMINE-DEXTROAMPHETAMINE 30 MG PO TABS: 30.0000 mg | ORAL_TABLET | Freq: Two times a day (BID) | ORAL | Status: DC

## 2015-06-30 NOTE — Telephone Encounter (Signed)
Spoke with mom Lurena JoinerRebecca (per patient) that prescription for Adderall is ready for pick-up

## 2015-06-30 NOTE — Telephone Encounter (Signed)
Adderall refilled to be sent to patient in [place]. Last rx's returned as undeliverable and shredded.

## 2015-07-30 ENCOUNTER — Telehealth: Payer: Self-pay

## 2015-07-30 DIAGNOSIS — F1010 Alcohol abuse, uncomplicated: Secondary | ICD-10-CM

## 2015-07-30 DIAGNOSIS — R569 Unspecified convulsions: Secondary | ICD-10-CM

## 2015-07-30 NOTE — Telephone Encounter (Signed)
Patient's father is calling to leave a message for Dr. Theotis Barrio. He states that the patient is not in great health at the moment and would he like a call back from Dr. Merla Riches. He also states they've been a patient of Dr. Merla Riches for 30 years and though he should pass some important information along. Please call Theron Arista! [phone]

## 2015-07-31 ENCOUNTER — Encounter: Payer: Self-pay | Admitting: Internal Medicine

## 2015-08-01 DIAGNOSIS — F1021 Alcohol dependence, in remission: Secondary | ICD-10-CM | POA: Insufficient documentation

## 2015-08-01 DIAGNOSIS — R569 Unspecified convulsions: Secondary | ICD-10-CM | POA: Insufficient documentation

## 2015-08-01 NOTE — Telephone Encounter (Signed)
Father called to report hospitalization in [place] after seizure(on wellbutr and adderall) thought linked to alcohol abuse which has been discovered recently--required high doses of ativan,5 d on vent,now seeing psych and AA. We agreed meds only thru psych from now on as her stories of sxt might not have been accurate in past

## 2016-02-09 ENCOUNTER — Ambulatory Visit (INDEPENDENT_AMBULATORY_CARE_PROVIDER_SITE_OTHER): Payer: Self-pay | Admitting: Internal Medicine

## 2016-02-09 VITALS — BP 140/80 | HR 105 | Temp 98.2°F | Resp 20 | Ht 62.0 in | Wt 132.4 lb

## 2016-02-09 DIAGNOSIS — F988 Other specified behavioral and emotional disorders with onset usually occurring in childhood and adolescence: Secondary | ICD-10-CM

## 2016-02-09 DIAGNOSIS — F909 Attention-deficit hyperactivity disorder, unspecified type: Secondary | ICD-10-CM

## 2016-02-09 MED ORDER — DROSPIRENONE-ETHINYL ESTRADIOL 3-0.02 MG PO TABS: ORAL_TABLET | ORAL | Status: DC

## 2016-02-09 MED ORDER — AMPHETAMINE-DEXTROAMPHETAMINE 30 MG PO TABS: 30.0000 mg | ORAL_TABLET | Freq: Two times a day (BID) | ORAL | Status: DC

## 2016-02-12 NOTE — Progress Notes (Signed)
F/u Patient Active Problem List   Diagnosis Date Noted  . Convulsions/seizures--hosp [facility] 07/2015--etoh!! 08/01/2015  . Alcohol abuse 08/01/2015  . PMDD (premenstrual dysphoric disorder) 10/16/2013  . ADD (attention deficit disorder) 05/08/2013  . Depression, reactive 05/08/2013   Last ov here 11/2014 She moved to [facility] and slowly fell into dysfunctional state--drinking too much, becoming isolated, and eventually had an "event" requiring hospitalization--this note from disc with dad 08/01/15: Father called to report hospitalization in [place] after seizure(on wellbutr and adderall) thought linked to alcohol abuse which has been discovered recently--required high doses of ativan,5 d on vent,now seeing psych and AA. 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 Much better-off etoh now with intown social and family life She wants to resume ADD meds now ///very easily distracted and feels that this causes procrastination and frustration at incomplete tasks(multiple lists that never get completed) Intervention in [place] did not result in any long term treatments-she says she was not happy with all the hassles of living there/working there. Continues OCPs//no current relat Last partner remains a good friend She maintains that she is happy and not depressed--has had anxiety but not panic attacks Has plans for going back to [place] to work as well as a Advertising copywriter. Feels like fam life here supportive and stable--sister has moved back home to work. Father and mother divorced long ago. 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 Gets best work done late at night. No flights of ideas/no grandiosity. No delusional thinking. No hx mania.  IMP --will restart adderall with stipulation that she explore her recent problems, perhaps in diary, and return in 3-4 weeks to discuss and decide if she needs further psychiatric involvement  PLAN Meds ordered this encounter  Medications  . amphetamine-dextroamphetamine (ADDERALL) 30 MG tablet    Sig: Take 1 tablet by mouth 2 (two) times daily.    Dispense:  60 tablet    Refill:  0  . drospirenone-ethinyl estradiol (LORYNA) 3-0.02 MG tablet    Sig: TAKE 1 TABLET EVERY DAY AS DIRECTED.  "OV NEEDED"    Dispense:  84 tablet    Refill:  3   To diary

## 2016-03-02 ENCOUNTER — Ambulatory Visit (INDEPENDENT_AMBULATORY_CARE_PROVIDER_SITE_OTHER): Payer: BLUE CROSS/BLUE SHIELD | Admitting: Internal Medicine

## 2016-03-02 ENCOUNTER — Encounter: Payer: Self-pay | Admitting: Internal Medicine

## 2016-03-02 VITALS — BP 110/70 | HR 87 | Temp 98.7°F | Resp 16 | Ht 64.0 in | Wt 125.0 lb

## 2016-03-02 DIAGNOSIS — F909 Attention-deficit hyperactivity disorder, unspecified type: Secondary | ICD-10-CM

## 2016-03-02 DIAGNOSIS — F988 Other specified behavioral and emotional disorders with onset usually occurring in childhood and adolescence: Secondary | ICD-10-CM

## 2016-03-02 MED ORDER — AMPHETAMINE-DEXTROAMPHETAMINE 30 MG PO TABS: 30.0000 mg | ORAL_TABLET | Freq: Two times a day (BID) | ORAL | Status: DC

## 2016-03-02 NOTE — Progress Notes (Signed)
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 Convulsions/[place]izures--[facility]--etoh!! 08/01/2015  . Alcohol abuse(more overuse as slid into pattern of promising more than she could deliver and then being overwhelmed by inability to get it all done) 08/01/2015  . PMDD (premenstrual dysphoric disorder)---stable on hormones 10/16/2013  . ADD (attention deficit disorder) 05/08/2013  . Depression, reactive--stable by life choices at this point 05/08/2013    Current outpatient prescriptions:  .  amphetamine-dextroamphetamine (ADDERALL) 30 MG tablet, Take 1 tablet by mouth 2 (two) times daily. For 30 days or after from the Date of Signing, Disp: 60 tablet, Rfl: 0 .  drospirenone-ethinyl estradiol (LORYNA) 3-0.02 MG tablet, TAKE 1 TABLET EVERY DAY AS DIRECTED.  "OV NEEDED"--not til 2/18, Disp: 84 tablet, Rfl: 3  Exam BP 110/70 mmHg  Pulse 87  Temp(Src) 98.7 F (37.1 C)  Resp 16  Ht 5\' 4"  (1.626 m)  Wt 125 lb (56.7 kg)  BMI 21.45 kg/m2 Note bp normal Psych- stable//able to start discussion about 5 year plan Had never thought about that before At least is not overwhelmed with commitments and no answers at this point  IMP ADD (attention deficit disorder) - Plan: amphetamine-dextroamphetamine (ADDERALL) 30 MG tablet, amphetamine-dextroamphetamine (ADDERALL) 30 MG tablet  Reactive emotional problems-stable for now  Meds ordered this encounter  Medications   . amphetamine-dextroamphetamine (ADDERALL) 30 MG tablet    Sig: Take 1 tablet by mouth 2 (two) times daily. For 30 days or after from the Date of Signing    Dispense:  60 tablet    Refill:  0  . amphetamine-dextroamphetamine (ADDERALL) 30 MG tablet    Sig: Take 1 tablet by mouth 2 (two) times daily.    Dispense:  60 tablet    Refill:  0    Ok to fill today  . amphetamine-dextroamphetamine (ADDERALL) 30 MG tablet    Sig: Take 1 tablet by mouth 2 (two) times daily. For 60 d after signed    Dispense:  60 tablet    Refill:  0   F/u mid June walkin [facility]

## 2016-04-14 ENCOUNTER — Other Ambulatory Visit: Payer: Self-pay

## 2016-04-14 ENCOUNTER — Encounter: Payer: Self-pay | Admitting: Internal Medicine
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
## 2016-04-15 MED ORDER — DROSPIRENONE-ETHINYL ESTRADIOL 3-0.02 MG PO TABS: 1.0000 | ORAL_TABLET | Freq: Every day | ORAL | Status: DC

## 2016-04-15 NOTE — Telephone Encounter (Signed)
ocps sent in and other pended for someone to sign for her to get today or this weekend

## 2016-04-16 ENCOUNTER — Telehealth: Payer: Self-pay
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
## 2016-04-18 MED ORDER — AMPHETAMINE-DEXTROAMPHET ER 30 MG PO CP24: 30.0000 mg | ORAL_CAPSULE | ORAL | Status: DC
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
## 2016-04-18 NOTE — Telephone Encounter (Signed)
Since I just received this now, and you are here, thought it would be best for you to do the prescription.
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
## 2016-04-18 NOTE — Telephone Encounter (Signed)
Patient is calling to see if she can get a refill for adderral today. She states she's been waiting a week.

## 2016-04-19 ENCOUNTER — Encounter: Payer: Self-pay | Admitting: Internal Medicine

## 2016-04-19 MED ORDER — AMPHETAMINE-DEXTROAMPHETAMINE 30 MG PO TABS: 30.0000 mg | ORAL_TABLET | Freq: Two times a day (BID) | ORAL | Status: DC
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
## 2016-05-18 ENCOUNTER — Ambulatory Visit (INDEPENDENT_AMBULATORY_CARE_PROVIDER_SITE_OTHER): Payer: BLUE CROSS/BLUE SHIELD | Admitting: Internal Medicine

## 2016-05-18 VITALS — BP 120/80 | HR 114 | Temp 98.2°F | Resp 17 | Ht 62.0 in | Wt 116.0 lb

## 2016-05-18 DIAGNOSIS — F988 Other specified behavioral and emotional disorders with onset usually occurring in childhood and adolescence: Secondary | ICD-10-CM

## 2016-05-18 DIAGNOSIS — F909 Attention-deficit hyperactivity disorder, unspecified type: Secondary | ICD-10-CM | POA: Diagnosis not present

## 2016-05-18 MED ORDER — AMPHETAMINE-DEXTROAMPHETAMINE 30 MG PO TABS: 30.0000 mg | ORAL_TABLET | Freq: Two times a day (BID) | ORAL | Status: DC

## 2016-05-18 NOTE — Patient Instructions (Addendum)
Follow up is with Benny LennertSarah Weber PA-C    IF you received an x-ray today, you will receive an invoice from [facility]. Please contact [facility] at [phone] with questions or concerns regarding your invoice.   IF you received labwork today, you will receive an invoice from [facility]. Please contact [facility] at [phone] with questions or concerns regarding your invoice.   Our billing staff will not be able to assist you with questions regarding bills from these companies.  You will be contacted with the lab results as soon as they are available. The fastest way to get your results is to activate your My Chart account. Instructions are located on the last page of this paperwork. If you have not heard from us regarding the results in 2 weeks, please contact this office.

## 2016-05-18 NOTE — Progress Notes (Signed)
By signing my name below, I, Mesha Guinyard, attest that this documentation has been prepared under the direction and in the presence of Ellamae Siaobert Justo Hengel, MD.  Electronically Signed: Arvilla MarketMesha Guinyard, Medical Scribe. 05/18/2016. 3:27 PM.  Subjective:    Patient ID: Taylor [name], female    DOB: November 13, 1981, 35 y.o.   MRN: [id number]  HPI Chief Complaint  Patient presents with  . Medication Refill    ADDERALL     HPI Comments: Taylor [name] is a 35 y.o. female who presents to the [facility] for a medication refill. Pt states the last 3 months have been good. Pt has been busy and has been able to play more golf with her father(Dr Mosetta PuttPeter Hellmann). 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  Pt wants to get a refill on her adderall. Doing well. Pt doesn't need a refill on her birth control.   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 Convulsions[facility]ures[facility]--etoh!!----resolved 08/01/2015  . Alcohol abuse-------------------------------------------------resolved 08/01/2015  . PMDD (premenstrual dysphoric disorder) 10/16/2013  . ADD (attention deficit disorder) 05/08/2013  . Depression, reactive-----------------stable off meds///mood continues to improve 05/08/2013   Past Medical History  Diagnosis Date  . Anxiety   . Anemia   . Depression    No past surgical history on file. Allergies  Allergen Reactions  . Bee Venom    Prior to Admission medications   Medication Sig Start Date End Date Taking? Authorizing Provider  amphetamine-dextroamphetamine (ADDERALL) 30 MG tablet Take 1 tablet by mouth 2 (two) times daily. For 30 days or after from the Date of Signing 03/02/16  Yes Tonye Pearsonobert P Mihail Prettyman, MD  amphetamine-dextroamphetamine (ADDERALL) 30 MG tablet Take 1 tablet by  mouth 2 (two) times daily. 03/02/16  Yes Tonye Pearsonobert P Carmaleta Youngers, MD  amphetamine-dextroamphetamine (ADDERALL) 30 MG tablet Take 1 tablet by mouth 2 (two) times daily. For 60 d after signed 03/02/16  Yes Tonye Pearsonobert P Arabell Neria, MD  drospirenone-ethinyl estradiol (LORYNA) 3-0.02 MG tablet TAKE 1 TABLET EVERY DAY AS DIRECTED.  "OV NEEDED" 02/09/16  Yes Tonye Pearsonobert P Alicya Bena, MD  drospirenone-ethinyl estradiol (YAZ,GIANVI,LORYNA) 3-0.02 MG tablet Take 1 tablet by mouth daily. 04/15/16  Yes Tonye Pearsonobert P Brixton Franko, MD    Review of Systems [place]  Objective:  BP 120/80 mmHg  Pulse 114  Temp(Src) 98.2 F (36.8 C) (Oral)  Resp 17  Ht 5\' 2"  (1.575 m)  Wt 116 lb (52.617 kg)  BMI 21.21 kg/m2  SpO2 98%  Physical Exam  Constitutional: She is oriented to person, place, and time. She appears well-developed and well-nourished. No distress.  HENT:  Head: Normocephalic and atraumatic.  Eyes: Pupils are equal, round, and reactive to light.  Neck: Normal range of motion.  Cardiovascular: Normal rate and regular rhythm.   Pulmonary/Chest: Effort normal. No respiratory distress.  Musculoskeletal: Normal range of motion.  Neurological: She is alert and oriented to person, place, and time.  Skin: Skin is warm and dry.  Psychiatric: She has a normal mood and affect. Her behavior is normal.  Nursing note and vitals reviewed.    Assessment & Plan:  ADD (attention deficit disorder) - Plan: amphetamine-dextroamphetamine (ADDERALL) 30 MG tablet, amphetamine-dextroamphetamine (ADDERALL) 30 MG tablet 3 mo supply She will f/u with [facility] in 3 mos   I have completed the patient  encounter in its entirety as documented by the scribe, with editing by me where necessary. Izack Hoogland P. Merla Richesoolittle, M.D.

## 2016-06-27 ENCOUNTER — Telehealth: Payer: Self-pay | Admitting: Emergency Medicine

## 2016-06-27 ENCOUNTER — Encounter: Payer: Self-pay | Admitting: Emergency Medicine

## 2016-06-27 DIAGNOSIS — F1510 Other stimulant abuse, uncomplicated: Secondary | ICD-10-CM | POA: Insufficient documentation

## 2016-06-27 NOTE — Telephone Encounter (Signed)
I received a call from the patient's father. He states that the patient typically abuses alcohol with her Adderall. She is been hospitalized and on the ventilator related to alcohol Adderall use. He is requesting she never be prescribed Adderall in the future. She currently is relapsing and using alcohol again. I have flagged the chart to show a history of Adderall abuse.

## 2016-07-09 ENCOUNTER — Emergency Department (HOSPITAL_COMMUNITY)
Admission: EM | Admit: 2016-07-09 | Discharge: 2016-07-10 | Disposition: A | Discharge status: Home / Self Care | Payer: Self-pay | Attending: Emergency Medicine | Admitting: Emergency Medicine

## 2016-07-09 ENCOUNTER — Encounter (HOSPITAL_COMMUNITY): Payer: Self-pay | Admitting: Emergency Medicine

## 2016-07-09 DIAGNOSIS — F1010 Alcohol abuse, uncomplicated: Secondary | ICD-10-CM | POA: Insufficient documentation

## 2016-07-09 DIAGNOSIS — Z79899 Other long term (current) drug therapy: Secondary | ICD-10-CM | POA: Insufficient documentation

## 2016-07-09 DIAGNOSIS — R000 Tachycardia, unspecified: Secondary | ICD-10-CM | POA: Insufficient documentation

## 2016-07-09 LAB — CBG MONITORING, ED: Glucose-Capillary: 142 mg/dL — ABNORMAL HIGH (ref 65–99)

## 2016-07-09 LAB — CBC
HCT: 40.4 % (ref 36.0–46.0)
Hemoglobin: 13.7 g/dL (ref 12.0–15.0)
MCH: 31.2 pg (ref 26.0–34.0)
MCHC: 33.9 g/dL (ref 30.0–36.0)
MCV: 92 fL (ref 78.0–100.0)
Platelets: 159 10*3/uL (ref 150–400)
RBC: 4.39 MIL/uL (ref 3.87–5.11)
RDW: 17 % — ABNORMAL HIGH (ref 11.5–15.5)
WBC: 8.2 10*3/uL (ref 4.0–10.5)

## 2016-07-09 LAB — COMPREHENSIVE METABOLIC PANEL
ALT: 45 U/L (ref 14–54)
AST: 43 U/L — ABNORMAL HIGH (ref 15–41)
Albumin: 4.1 g/dL (ref 3.5–5.0)
Alkaline Phosphatase: 60 U/L (ref 38–126)
Anion gap: 10 (ref 5–15)
BUN: 6 mg/dL (ref 6–20)
CO2: 21 mmol/L — ABNORMAL LOW (ref 22–32)
Calcium: 8.9 mg/dL (ref 8.9–10.3)
Chloride: 105 mmol/L (ref 101–111)
Creatinine, Ser: 1.47 mg/dL — ABNORMAL HIGH (ref 0.44–1.00)
GFR calc Af Amer: 53 mL/min — ABNORMAL LOW (ref >60)
GFR calc non Af Amer: 46 mL/min — ABNORMAL LOW (ref >60)
Glucose, Bld: 135 mg/dL — ABNORMAL HIGH (ref 65–99)
Potassium: 3.2 mmol/L — ABNORMAL LOW (ref 3.5–5.1)
Sodium: 136 mmol/L (ref 135–145)
Total Bilirubin: 0.7 mg/dL (ref 0.3–1.2)
Total Protein: 6.9 g/dL (ref 6.5–8.1)

## 2016-07-09 LAB — RAPID URINE DRUG SCREEN, HOSP PERFORMED
Amphetamines: NOT DETECTED
Barbiturates: NOT DETECTED
Benzodiazepines: POSITIVE — AB
Cocaine: NOT DETECTED
Opiates: NOT DETECTED
Tetrahydrocannabinol: NOT DETECTED

## 2016-07-09 LAB — ETHANOL: Alcohol, Ethyl (B): <5 mg/dL (ref <5)

## 2016-07-09 LAB — I-STAT BETA HCG BLOOD, ED (MC, WL, AP ONLY): I-stat hCG, quantitative: <5 m[IU]/mL (ref <5)

## 2016-07-09 LAB — ACETAMINOPHEN LEVEL
ACETAMINOPHEN (TYLENOL), SERUM: 14 ug/mL (ref 10–30)
Acetaminophen (Tylenol), Serum: 14 ug/mL (ref 10–30)

## 2016-07-09 LAB — SALICYLATE LEVEL

## 2016-07-09 MED ORDER — SODIUM CHLORIDE 0.9 % IV BOLUS (SEPSIS)
1000.0000 mL | Freq: Once | INTRAVENOUS | Status: AC
  Administered 2016-07-09: 1000 mL via INTRAVENOUS

## 2016-07-09 MED ORDER — VITAMIN B-1 100 MG PO TABS: 100.0000 mg | ORAL_TABLET | Freq: Every day | ORAL | Status: DC

## 2016-07-09 MED ORDER — LORAZEPAM 1 MG PO TABS: 0.0000 mg | ORAL_TABLET | Freq: Four times a day (QID) | ORAL | Status: DC

## 2016-07-09 MED ORDER — THIAMINE HCL 100 MG/ML IJ SOLN
100.0000 mg | Freq: Every day | INTRAMUSCULAR | Status: DC
  Administered 2016-07-10: 100 mg via INTRAVENOUS
  Filled 2016-07-09: qty 2

## 2016-07-09 MED ORDER — LACTATED RINGERS IV BOLUS (SEPSIS)
1000.0000 mL | Freq: Once | INTRAVENOUS | Status: AC
  Administered 2016-07-09: 1000 mL via INTRAVENOUS

## 2016-07-09 MED ORDER — POTASSIUM CHLORIDE CRYS ER 20 MEQ PO TBCR
40.0000 meq | EXTENDED_RELEASE_TABLET | Freq: Once | ORAL | Status: AC
  Administered 2016-07-09: 40 meq via ORAL
  Filled 2016-07-09: qty 2

## 2016-07-09 MED ORDER — ONDANSETRON 4 MG PO TBDP: 4.0000 mg | ORAL_TABLET | Freq: Once | ORAL | Status: DC

## 2016-07-09 MED ORDER — LORAZEPAM 1 MG PO TABS: 0.0000 mg | ORAL_TABLET | Freq: Two times a day (BID) | ORAL | Status: DC

## 2016-07-09 MED ORDER — ONDANSETRON 4 MG PO TBDP
ORAL_TABLET | ORAL | Status: AC
  Filled 2016-07-09: qty 1

## 2016-07-09 MED ORDER — ONDANSETRON HCL 4 MG/2ML IJ SOLN
4.0000 mg | Freq: Once | INTRAMUSCULAR | Status: AC
  Administered 2016-07-09: 4 mg via INTRAVENOUS
  Filled 2016-07-09: qty 2

## 2016-07-09 NOTE — ED Provider Notes (Signed)
CSN: [id number]     Arrival date & time 07/09/16  1704 History   First MD Initiated Contact with Patient 07/09/16 1912     Chief Complaint  Patient presents with  . Ingestion     (Consider location/radiation/quality/duration/timing/severity/associated sxs/prior Treatment) HPI Comments: 35 year old female with history of depression, alcohol abuse, withdrawal seizures, and lateral disorder presents after ingestion of rubbing alcohol. Patient denies other drugs. Patient denies suicidal ideation. The past month patient is been drinking heavy alcohol and this is recurrent issue. No seizures today. Patient did have 90 days without alcohol earlier this year.  Patient is a 35 y.o. female presenting with Ingested Medication. The history is provided by the patient.  Ingestion Pertinent negatives include no chest pain, no abdominal pain, no headaches and no shortness of breath.    Past Medical History  Diagnosis Date  . Anxiety   . Anemia   . Depression    History reviewed. No pertinent past surgical history. History reviewed. No pertinent family history. Social History  Substance Use Topics  . Smoking status: Never Smoker   . Smokeless tobacco: None  . Alcohol Use: 3.0 oz/week    6 drink(s) per week   OB History    No data available     Review of Systems  Constitutional: Positive for appetite change. Negative for fever and chills.  HENT: Negative for congestion.   Eyes: Negative for visual disturbance.  Respiratory: Negative for shortness of breath.   Cardiovascular: Negative for chest pain.  Gastrointestinal: Positive for nausea and vomiting. Negative for abdominal pain.  Genitourinary: Negative for dysuria and flank pain.  Musculoskeletal: Negative for back pain, neck pain and neck stiffness.  Skin: Negative for rash.  Neurological: Positive for light-headedness. Negative for headaches.      Allergies  Bee venom  Home Medications   Prior to Admission medications    Medication Sig Start Date End Date Taking? Authorizing Provider  drospirenone-ethinyl estradiol (LORYNA) 3-0.02 MG tablet TAKE 1 TABLET EVERY DAY AS DIRECTED.  "OV NEEDED" 02/09/16  Yes Tonye Pearson, MD  amphetamine-dextroamphetamine (ADDERALL) 30 MG tablet Take 1 tablet by mouth 2 (two) times daily. For 06/27/16 or after days or after 05/18/16   Tonye Pearson, MD  amphetamine-dextroamphetamine (ADDERALL) 30 MG tablet Take 1 tablet by mouth 2 (two) times daily. OK to fill today due to travel. 05/18/16   Tonye Pearson, MD  amphetamine-dextroamphetamine (ADDERALL) 30 MG tablet Take 1 tablet by mouth 2 (two) times daily. For 07/27/16 or after 05/18/16   Tonye Pearson, MD  drospirenone-ethinyl estradiol Pierre Bali) 3-0.02 MG tablet Take 1 tablet by mouth daily. 04/15/16   Tonye Pearson, MD   BP 121/88 mmHg  Pulse 95  Temp(Src) 97.9 F (36.6 C) (Oral)  Resp 20  SpO2 100% Physical Exam  Constitutional: She is oriented to person, place, and time. She appears well-developed and well-nourished.  HENT:  Head: Normocephalic and atraumatic.  Eyes: Conjunctivae are normal. Right eye exhibits no discharge. Left eye exhibits no discharge.  Neck: Normal range of motion. Neck supple. No tracheal deviation present.  Cardiovascular: Regular rhythm.  Tachycardia present.   Pulmonary/Chest: Effort normal and breath sounds normal.  Abdominal: Soft. She exhibits no distension. There is no tenderness. There is no guarding.  Musculoskeletal: She exhibits no edema.  Neurological: She is alert and oriented to person, place, and time.  Mild clinical intoxication, equal strength upper lower extremities. Pupils equal bilateral X A[place] muscle function intact  Skin: Skin  is warm. No rash noted.  Psychiatric: She expresses no suicidal plans and no homicidal plans.  Mild clinical intoxication  Nursing note and vitals reviewed.   ED Course  Procedures (including critical care time) Labs  Review Labs Reviewed  COMPREHENSIVE METABOLIC PANEL - Abnormal; Notable for the following:    Potassium 3.2 (*)    CO2 21 (*)    Glucose, Bld 135 (*)    Creatinine, Ser 1.47 (*)    AST 43 (*)    GFR calc non Af Amer 46 (*)    GFR calc Af Amer 53 (*)    All other components within normal limits  CBC - Abnormal; Notable for the following:    RDW 17.0 (*)    All other components within normal limits  CBG MONITORING, ED - Abnormal; Notable for the following:    Glucose-Capillary 142 (*)    All other components within normal limits  ETHANOL  SALICYLATE LEVEL  ACETAMINOPHEN LEVEL  URINE RAPID DRUG SCREEN, HOSP PERFORMED  I-STAT BETA HCG BLOOD, ED ([facility], [facility], [facility] ONLY)    Imaging Review No results found. I have personally reviewed and evaluated these images and lab results as part of my medical decision-making.   EKG Interpretation   Date/Time:  Saturday July 09 2016 18:34:23 EDT Ventricular Rate:  123 PR Interval:  114 QRS Duration: 78 QT Interval:  314 QTC Calculation: 449 R Axis:   74 Text Interpretation:  Sinus tachycardia with occasional and consecutive  Premature ventricular complexes Septal infarct , age undetermined Abnormal  ECG Confirmed by Prerna Harold MD, Dhruti Ghuman [phone]) on 07/09/2016 7:55:59 PM      MDM   Final diagnoses:  Alcohol abuse   Patient presents with recurrent alcohol abuse. Patient is unsure if she wants further this time. Plan for IV fluids, observation for 46 hours. Plan for behavioral health assessment to assist with treatment program if patient is interested. Family in the room during discussion. Pt has hx of withdrawal seizures.  No seizures in the ED.   Pt improved on reassessment.  Behavioral health recommends inpatient detox.  Placement pending.    Blane Ohara, MD 07/10/16 1536
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
## 2016-07-09 NOTE — ED Notes (Signed)
Per Poison Control - Symptoms of OD on rubbing alcohol include tachycardia, nausea, vomiting, fatigue, and hypo OR hyperglycemia.  Poison control recommends a 4hr acetaminophen level, CMP, EKG and IV fluids with 4-6 hours of observation, as long as symptoms resolve.

## 2016-07-09 NOTE — ED Notes (Signed)
poison control contacted and will follow up again in a few hours
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
## 2016-07-09 NOTE — BH Assessment (Signed)
Faxed clinical information to the following facilities for placement:  [facility]  The following facilities are currently at capacity:  [facility]   [address] Patsy [place], [facility], [facility], [facility] Triage Specialist [phone]

## 2016-07-09 NOTE — ED Notes (Signed)
Pt presents to ED for assessment of ingestion of rubbing alcohol.  Pt has a hx of alcohol dependency with recent hospitilization for withdrawals.  Last summer pt was in ICU for withdrawals.  Pt sts she was clean for approx 90 days, but relapsed approx 20 days ago.  Today pt was unable to find any alcohol in the house so she drank rubbing alcohol.  Pt sts approx 4 oz.

## 2016-07-10 MED ORDER — ONDANSETRON HCL 4 MG/2ML IJ SOLN
INTRAMUSCULAR | Status: AC
  Filled 2016-07-10: qty 2

## 2016-07-10 MED ORDER — CHLORDIAZEPOXIDE HCL 25 MG PO CAPS: ORAL_CAPSULE | ORAL | 0 refills | Status: DC

## 2016-07-10 MED ORDER — ACETAMINOPHEN 325 MG PO TABS
ORAL_TABLET | ORAL | Status: AC
  Filled 2016-07-10: qty 2

## 2016-07-10 NOTE — ED Notes (Signed)
Er md at bedside talking with family about possible discharge and outpatient rehab, family states they would like to talk, will reassess

## 2016-07-10 NOTE — ED Provider Notes (Signed)
Reassess the patient, improved significantly in terms of overall energy and no longer clinically intoxicated. Patient and family are hopeful for rehabilitation. Unfortunately no open beds at this time none expected for the next 24 hours per [facility]. Patient does have an appointment on Monday with detox/rehabilitation center. Discussed discharge this afternoon with family support and patient wishes to attend that appointment tomorrow.  Blane Ohara Enid Skeens, MD 07/10/16 1537

## 2016-07-10 NOTE — Progress Notes (Signed)
Pt was declined at [facility] due to hx of DT's and seizures.  CSW completed additional patient referrals to the following inpatient psych facilities:  [facility]  CSW will continue to follow patient for placement needs.  Seward Speck [facility] Disposition CSW [phone]

## 2016-07-10 NOTE — ED Notes (Signed)
Pt ambulated to bathrooom

## 2016-07-10 NOTE — ED Notes (Signed)
Pt nauseated  Med given

## 2016-07-10 NOTE — Discharge Instructions (Signed)
Follow up at [facility] tomorrow.  If you were given medicines take as directed.  If you are on coumadin or contraceptives realize their levels and effectiveness is altered by many different medicines.  If you have any reaction (rash, tongues swelling, other) to the medicines stop taking and see a physician.    If your blood pressure was elevated in the ER make sure you follow up for management with a primary doctor or return for chest pain, shortness of breath or stroke symptoms.  Please follow up as directed and return to the ER or see a physician for new or worsening symptoms.  Thank you. Vitals:   07/10/16 1445 07/10/16 1500 07/10/16 1530 07/10/16 1545  BP: 108/78 112/83 112/76 107/77  Pulse: 78 85 84 73  Resp: 19 22 23 21   Temp:      TempSrc:      SpO2: 100% 100% 100% 100%

## 2016-07-10 NOTE — ED Notes (Signed)
Pt resting, mother and sitter at bedside

## 2016-07-10 NOTE — ED Notes (Addendum)
[facility] contacted for an update on patient, states that the patients chart is still being reviewed at 3 facilities and he will call me with an update, patient and family notified

## 2016-07-10 NOTE — ED Notes (Signed)
After talking with family and physcian, the patient and family have decided to be discharged and [facility], the patient has voiced her comfort of going home with mom and being taken in the morning, she is feeling much better, her CIWA remains 1 or below she still has some mild nausea.

## 2016-07-10 NOTE — ED Notes (Signed)
The pt is alert talking to her father she just went to the br   Now wants breakfast  At 0500am   She did not want  Breakfast ordered then but she now wants to try  Eating something

## 2016-12-19 NOTE — L&D Delivery Note (Signed)
Delivery Note At 8:48 AM a viable and healthy female was delivered via Vaginal, Spontaneous (Presentation: LOA  ).  APGAR: 8, 9; weight  pending.   Placenta status: spontaneous, intact.  Cord:  with the following complications: Loose Spencer x one .  Cord pH: na  Anesthesia: epidural  Episiotomy: None Lacerations: 2nd degree;Perineal Suture Repair: 2.0 3.0 vicryl rapide Est. Blood Loss (mL):  300  Mom to postpartum.  Baby to Couplet care / Skin to Skin.  Ebon Ketchum J 11/27/2017, 9:22 AM

## 2017-01-26 ENCOUNTER — Emergency Department (HOSPITAL_COMMUNITY)
Admission: EM | Admit: 2017-01-26 | Discharge: 2017-01-27 | Disposition: A | Discharge status: Home / Self Care | Payer: BLUE CROSS/BLUE SHIELD | Attending: Emergency Medicine | Admitting: Emergency Medicine

## 2017-01-26 ENCOUNTER — Encounter (HOSPITAL_COMMUNITY): Payer: Self-pay

## 2017-01-26 DIAGNOSIS — R569 Unspecified convulsions: Secondary | ICD-10-CM | POA: Diagnosis present

## 2017-01-26 DIAGNOSIS — F10230 Alcohol dependence with withdrawal, uncomplicated: Secondary | ICD-10-CM

## 2017-01-26 DIAGNOSIS — F10231 Alcohol dependence with withdrawal delirium: Secondary | ICD-10-CM | POA: Insufficient documentation

## 2017-01-26 MED ORDER — CHLORDIAZEPOXIDE HCL 25 MG PO CAPS: ORAL_CAPSULE | ORAL | 0 refills | Status: DC

## 2017-01-26 MED ORDER — SODIUM CHLORIDE 0.9 % IV BOLUS (SEPSIS)
1000.0000 mL | Freq: Once | INTRAVENOUS | Status: AC
  Administered 2017-01-26: 1000 mL via INTRAVENOUS

## 2017-01-26 MED ORDER — LORAZEPAM 2 MG/ML IJ SOLN
2.0000 mg | Freq: Once | INTRAMUSCULAR | Status: AC
  Administered 2017-01-26: 2 mg via INTRAVENOUS
  Filled 2017-01-26: qty 1

## 2017-01-26 MED ORDER — LORAZEPAM 2 MG/ML IJ SOLN
1.0000 mg | Freq: Once | INTRAMUSCULAR | Status: AC
  Administered 2017-01-26: 1 mg via INTRAVENOUS
  Filled 2017-01-26: qty 1

## 2017-01-26 MED ORDER — CHLORDIAZEPOXIDE HCL 25 MG PO CAPS
100.0000 mg | ORAL_CAPSULE | Freq: Once | ORAL | Status: AC
  Administered 2017-01-26: 100 mg via ORAL
  Filled 2017-01-26: qty 4

## 2017-01-26 NOTE — ED Notes (Signed)
Helped put into gown and

## 2017-01-26 NOTE — ED Provider Notes (Signed)
[facility]-EMERGENCY DEPT Provider Note   CSN: [id number] Arrival date & time: 01/26/17  2101     History   Chief Complaint Chief Complaint  Patient presents with  . Seizures    Pt had a seizure tonight last two minutes as per father who witnessed seizure episode, pt has a hx of seizures when withdrawing from alcohol     HPI Taylor [name] is a 36 y.o. female.  36 yo F with a cc of a seizure.  Patient has been trying to quit drinking alcohol. This is a 3 since her last drink. She has had seizures before when she stopped drinking. She is planning to cold cold [place] until she is able to fully withdrawal. She has done rehabilitation in the past as well. Has been having tremors for the past couple days. She has also been feeling generally unwell. Denies other illegal drug use. Denies any injury during the seizure. The seizure was a witnessed event by her father she dropped to the ground and had full tonic-clonic activity. She was postictal for about 5 minutes.   The history is provided by the patient.  Seizures   This is a new problem. The current episode started less than 1 hour ago. The problem has not changed since onset.There was 1 seizure. The most recent episode lasted 30 to 120 seconds. Pertinent negatives include no headaches, no visual disturbance, no chest pain, no nausea and no vomiting. The episode was witnessed. There was no sensation of an aura present. The seizures did not continue in the ED. The seizure(s) had no focality. There has been no fever. The fever has been present for less than 1 day.    Past Medical History:  Diagnosis Date  . Anemia   . Anxiety   . Depression     Patient Active Problem List   Diagnosis Date Noted  . Adderall use disorder, mild, abuse 06/27/2016  . Convulsions/seizures--[facility] 07/2015--etoh!! 08/01/2015  . Alcohol abuse 08/01/2015  . PMDD (premenstrual dysphoric disorder) 10/16/2013  . ADD (attention deficit disorder) 05/08/2013  .  Depression, reactive 05/08/2013    History reviewed. No pertinent surgical history.  OB History    No data available       Home Medications    Prior to Admission medications   Medication Sig Start Date End Date Taking? Authorizing Provider  buPROPion (WELLBUTRIN XL) 150 MG 24 hr tablet Take 150 mg by mouth daily. 01/26/17  Yes Historical Provider, MD  drospirenone-ethinyl estradiol (YAZ,GIANVI,LORYNA) 3-0.02 MG tablet Take 1 tablet by mouth daily. 04/15/16  Yes Tonye Pearson, MD  gabapentin (NEURONTIN) 100 MG capsule Take 200 mg by mouth at bedtime. 10/28/16  Yes Historical Provider, MD  naltrexone (DEPADE) 50 MG tablet Take 50 mg by mouth at bedtime.    Yes Historical Provider, MD  amphetamine-dextroamphetamine (ADDERALL) 30 MG tablet Take 1 tablet by mouth 2 (two) times daily. OK to fill today due to travel. 05/18/16   Tonye Pearson, MD  chlordiazePOXIDE (LIBRIUM) 25 MG capsule 50mg  PO TID x 1D, then 25-50mg  PO BID X 1D, then 25-50mg  PO QD X 1D 01/26/17   Melene Plan, DO    Family History No family history on file.  Social History Social History  Substance Use Topics  . Smoking status: Never Smoker  . Smokeless tobacco: Never Used  . Alcohol use 3.0 oz/week    6 Standard drinks or equivalent per week     Allergies   Bee venom  Review of Systems Review of Systems  Constitutional: Negative for chills and fever.  HENT: Negative for congestion and rhinorrhea.   Eyes: Negative for redness and visual disturbance.  Respiratory: Negative for shortness of breath and wheezing.   Cardiovascular: Negative for chest pain and palpitations.  Gastrointestinal: Negative for nausea and vomiting.  Genitourinary: Negative for dysuria and urgency.  Musculoskeletal: Negative for arthralgias and myalgias.  Skin: Negative for pallor and wound.  Neurological: Positive for tremors and seizures. Negative for dizziness and headaches.     Physical Exam Updated Vital Signs BP 121/71    Pulse 111   Temp 98.3 F (36.8 C) (Oral)   Resp 23   Ht 5\' 2"  (1.575 m)   Wt 118 lb (53.5 kg)   SpO2 98%   BMI 21.58 kg/m   Physical Exam  Constitutional: She is oriented to person, place, and time. She appears well-developed and well-nourished. No distress.  HENT:  Head: Normocephalic and atraumatic.  Eyes: EOM are normal. Pupils are equal, round, and reactive to light.  Neck: Normal range of motion. Neck supple.  Cardiovascular: Regular rhythm.  Tachycardia present.  Exam reveals no gallop and no friction rub.   No murmur heard. Pulmonary/Chest: Effort normal. She has no wheezes. She has no rales.  Abdominal: Soft. She exhibits no distension. There is no tenderness.  Musculoskeletal: She exhibits no edema or tenderness.  Neurological: She is alert and oriented to person, place, and time.  Skin: Skin is warm and dry. She is not diaphoretic.  Psychiatric: She has a normal mood and affect. Her behavior is normal.  Nursing note and vitals reviewed.    ED Treatments / Results  Labs (all labs ordered are listed, but only abnormal results are displayed) Labs Reviewed - No data to display  EKG  EKG Interpretation None       Radiology No results found.  Procedures Procedures (including critical care time)  Medications Ordered in ED Medications  LORazepam (ATIVAN) injection 1 mg (1 mg Intravenous Given 01/26/17 2138)  chlordiazePOXIDE (LIBRIUM) capsule 100 mg (100 mg Oral Given 01/26/17 2136)  LORazepam (ATIVAN) injection 1 mg (1 mg Intravenous Given 01/26/17 2254)  sodium chloride 0.9 % bolus 1,000 mL (0 mLs Intravenous Stopped 01/27/17 0036)  LORazepam (ATIVAN) injection 2 mg (2 mg Intravenous Given 01/26/17 2359)     Initial Impression / Assessment and Plan / ED Course  I have reviewed the triage vital signs and the nursing notes.  Pertinent labs & imaging results that were available during my care of the patient were reviewed by me and considered in my medical decision  making (see chart for details).     36 yo F with a chief complaint of alcohol withdrawal. Discussed with the patient that she may need to drink in order to make it to rehabilitation facility where they can fully work on getting her to withdrawal from the alcohol. I told her that this may kill her if she were to try and do it on her own. The patient is hesitant to drink because she thinks that will cause her to rebinge.  Tachycardic and hypertensive on arrival. Tachycardia and hypertension improved with Ativan and Librium.  Patient feeling much better, will follow up with rehab tomorrow.  Encouraged to drink if needed.   3:01 PM:  I have discussed the diagnosis/risks/treatment options with the patient and family and believe the pt to be eligible for discharge home to follow-up with Rehab. We also discussed returning to the  ED immediately if new or worsening sx occur. We discussed the sx which are most concerning (e.g., seizure, DTs, feeling worse) that necessitate immediate return. Medications administered to the patient during their visit and any new prescriptions provided to the patient are listed below.  Medications given during this visit Medications  LORazepam (ATIVAN) injection 1 mg (1 mg Intravenous Given 01/26/17 2138)  chlordiazePOXIDE (LIBRIUM) capsule 100 mg (100 mg Oral Given 01/26/17 2136)  LORazepam (ATIVAN) injection 1 mg (1 mg Intravenous Given 01/26/17 2254)  sodium chloride 0.9 % bolus 1,000 mL (0 mLs Intravenous Stopped 01/27/17 0036)  LORazepam (ATIVAN) injection 2 mg (2 mg Intravenous Given 01/26/17 2359)     The patient appears reasonably screen and/or stabilized for discharge and I doubt any other medical condition or other [facility] requiring further screening, evaluation, or treatment in the ED at this time prior to discharge.    Final Clinical Impressions(s) / ED Diagnoses   Final diagnoses:  Alcohol withdrawal seizure without complication [facility](HCC)    New Prescriptions Discharge  Medication List as of 01/27/2017 12:21 AM       Melene Planan Khloei Spiker, DO 01/27/17 1501

## 2017-01-26 NOTE — ED Triage Notes (Signed)
Pt is withdrawing from alcohol last drink was two days ago had seizure

## 2017-01-27 NOTE — ED Notes (Signed)
Pt departed with signif other and in NAD.

## 2017-01-27 NOTE — Discharge Instructions (Signed)
Follow up with a rehab facility.  Return for sudden worsening symptoms, repeat seizure event.

## 2017-01-30 ENCOUNTER — Encounter: Payer: Self-pay | Admitting: Family Medicine

## 2017-01-30 ENCOUNTER — Ambulatory Visit (INDEPENDENT_AMBULATORY_CARE_PROVIDER_SITE_OTHER): Payer: BLUE CROSS/BLUE SHIELD | Admitting: Family Medicine

## 2017-01-30 VITALS — BP 125/88 | HR 88 | Temp 98.0°F | Resp 16 | Ht 62.0 in | Wt 122.6 lb

## 2017-01-30 DIAGNOSIS — F1010 Alcohol abuse, uncomplicated: Secondary | ICD-10-CM | POA: Diagnosis not present

## 2017-01-30 DIAGNOSIS — F329 Major depressive disorder, single episode, unspecified: Secondary | ICD-10-CM | POA: Diagnosis not present

## 2017-01-30 DIAGNOSIS — F410 Panic disorder [episodic paroxysmal anxiety] without agoraphobia: Secondary | ICD-10-CM

## 2017-01-30 DIAGNOSIS — F419 Anxiety disorder, unspecified: Secondary | ICD-10-CM | POA: Diagnosis not present

## 2017-01-30 DIAGNOSIS — F411 Generalized anxiety disorder: Secondary | ICD-10-CM | POA: Insufficient documentation

## 2017-01-30 DIAGNOSIS — F1510 Other stimulant abuse, uncomplicated: Secondary | ICD-10-CM

## 2017-01-30 NOTE — Patient Instructions (Signed)
     IF you received an x-ray today, you will receive an invoice from [facility]. Please contact [facility] at [phone] with questions or concerns regarding your invoice.   IF you received labwork today, you will receive an invoice from [facility]. Please contact [facility] at [phone] with questions or concerns regarding your invoice.   Our billing staff will not be able to assist you with questions regarding bills from these companies.  You will be contacted with the lab results as soon as they are available. The fastest way to get your results is to activate your My Chart account. Instructions are located on the last page of this paperwork. If you have not heard from us regarding the results in 2 weeks, please contact this office.     

## 2017-01-30 NOTE — Progress Notes (Signed)
Subjective:    Patient ID: Taylor [name], female    DOB: 05-11-1981, 36 y.o.   MRN: [id number] Chief Complaint  Patient presents with  . Medication Refill    Adderall and get back on Xanax; an low dose    HPI  Ms. Taylor [name] is a delightful 36 yo woman here today requesting refills on her chronic medications.  [facility]  She didn't have a bad relapse and wasn't binge drinking liquor but she was drinking to much - maybe about 6-7 beers/d.  After 3d she  She hasn't had any bad side effects from the naltrexone and has tapered off the librium. Now she realizes, she can't have a beer or two  She did see psych She is seeing Dr. Penelope Galas who is a psychologist in [place] - has had one appt with him.   Has had some ideas who to ask for [facility]   She has a horrible GI system so can't stay on if it causes it. Doesn't want it to make her   Once she starts, she can't stop but feels like it   She tried celexa but didn't like it.  She did a preview with Dr. Marlyne Beards at [facility] - psychiatry on 2/8 - her ADD is bad and the 20mg  is not working.  And she doesn't like the XR - it causes a bad mood.   Dr. Lorenz Coaster had prescribed pt #30 alprazolam 0.5mg  in 2013 - both Epic records and   Past Medical History:  Diagnosis Date  . Anemia   . Anxiety   . Depression    History reviewed. No pertinent surgical history. Current Outpatient Prescriptions on File Prior to Visit  Medication Sig Dispense Refill  . amphetamine-dextroamphetamine (ADDERALL) 30 MG tablet Take 1 tablet by mouth 2 (two) times daily. OK to fill today due to travel. 60 tablet 0  . buPROPion (WELLBUTRIN XL) 150 MG 24 hr tablet Take 150 mg by mouth daily.  1  . drospirenone-ethinyl estradiol (YAZ,GIANVI,LORYNA) 3-0.02 MG tablet Take 1 tablet by mouth daily. 1 Package 0  . naltrexone (DEPADE) 50 MG tablet Take 50 mg by mouth at bedtime.     . chlordiazePOXIDE (LIBRIUM) 25 MG capsule 50mg  PO TID x 1D, then  25-50mg  PO BID X 1D, then 25-50mg  PO QD X 1D (Patient not taking: Reported on 01/30/2017) 10 capsule 0  . gabapentin (NEURONTIN) 100 MG capsule Take 200 mg by mouth at bedtime.  0   No current facility-administered medications on file prior to visit.    Allergies  Allergen Reactions  . Bee Venom Anaphylaxis   History reviewed. No pertinent family history. Social History   Social History  . Marital status: Single    Spouse name: N/A  . Number of children: N/A  . Years of education: N/A   Social History Main Topics  . Smoking status: Never Smoker  . Smokeless tobacco: Never Used  . Alcohol use 3.0 oz/week    6 Standard drinks or equivalent per week  . Drug use: No  . Sexual activity: Yes    Birth control/ protection: Pill   Other Topics Concern  . None   Social History Narrative  . None   Depression screen [facility] 2/9 01/30/2017 05/18/2016 03/02/2016 02/09/2016  Decreased Interest 0 0 0 0  Down, Depressed, Hopeless 0 0 0 0  PHQ - 2 Score 0 0 0 0    Review of Systems See hpi  Objective:   Physical Exam  Constitutional: She is oriented to person, place, and time. She appears well-developed and well-nourished. No distress.  HENT:  Head: Normocephalic and atraumatic.  Right Ear: External ear normal.  Eyes: Conjunctivae are normal. No scleral icterus.  Pulmonary/Chest: Effort normal.  Neurological: She is alert and oriented to person, place, and time.  Skin: Skin is warm and dry. She is not diaphoretic. No erythema.  Psychiatric: She has a normal mood and affect. Her behavior is normal.      BP 125/88 (BP Location: Right Arm, Patient Position: Sitting, Cuff Size: Normal)   Pulse 88   Temp 98 F (36.7 C) (Oral)   Resp 16   Ht 5\' 2"  (1.575 m)   Wt 122 lb 9.6 oz (55.6 kg)   SpO2 100%   BMI 22.42 kg/m      Assessment & Plan:  Alcohol abuse - pt was in the ER from w/d again last wk. Has had seizures during w/d prior, and once requiring intubation.  The ER note  states that pt was planning to go to another inpt program within sev d but pt now denies any intention of such and states she feels confident in her sobriety.    Adderall use disorder, mild, abuse - h/o mixing stimulant and EtOH. Unfortunately, I feel that she is being dishonest and evasive by omission of the fact that she was rx'ed adderall sev d prior from psychiatrist.  During our visit I recommended that pt see a psychiatrist and asked if she had considered that or thought about who she would like to see. She stated that she had "heard good things about Dr. Marlyne BeardsJennings" so [facility].  Then I saw from the [facility] that she has been prescribed adderall by Dr.Jennings just several days prior.  Pt states she forgot about that and that it wasn't the dose she wanted which is why she presented here.  Depression, reactive  Anxiety  Panic attacks - pt requesting xanax and states that Dr. Marlyne BeardsJennings told her that she would be fine on xanax and stimulant but that he wouldn't/couldn't rx the xanax so she would need to see her PCP for that - so is here to establish.   W/ pt's permission, I spoke with Dr. Marlyne BeardsJennings and informed him of her requests. Dr. Marlyne BeardsJennings stated that the had told pt that he was not willing to rx her xanax at that time, in part because he did not have any of her prior medical records to confirm that she was using it as sparingly as she reports.  I advised Dr. Marlyne BeardsJennings that I would defer all psych meds inc controlled substances to him.  Pt to return for any further medical care she needs.     Taylor [name], M.D.  Primary Care at [facility]  [place] [address] [place], [place] [address] [phone] phone [phone] fax  01/30/17 12:20 PM

## 2017-11-26 ENCOUNTER — Encounter (HOSPITAL_COMMUNITY): Payer: Self-pay | Admitting: *Deleted

## 2017-11-26 ENCOUNTER — Observation Stay (HOSPITAL_COMMUNITY): Payer: BLUE CROSS/BLUE SHIELD | Admitting: Anesthesiology

## 2017-11-26 ENCOUNTER — Inpatient Hospital Stay (HOSPITAL_COMMUNITY)
Admission: AD | Admit: 2017-11-26 | Discharge: 2017-11-29 | DRG: 807 | Disposition: A | Discharge status: Home / Self Care | Payer: BLUE CROSS/BLUE SHIELD | Source: Ambulatory Visit | Attending: Obstetrics and Gynecology | Admitting: Obstetrics and Gynecology

## 2017-11-26 ENCOUNTER — Other Ambulatory Visit: Payer: Self-pay

## 2017-11-26 DIAGNOSIS — O4292 Full-term premature rupture of membranes, unspecified as to length of time between rupture and onset of labor: Secondary | ICD-10-CM | POA: Diagnosis present

## 2017-11-26 DIAGNOSIS — Z3A39 39 weeks gestation of pregnancy: Secondary | ICD-10-CM

## 2017-11-26 DIAGNOSIS — F17210 Nicotine dependence, cigarettes, uncomplicated: Secondary | ICD-10-CM | POA: Diagnosis present

## 2017-11-26 DIAGNOSIS — O99334 Smoking (tobacco) complicating childbirth: Secondary | ICD-10-CM | POA: Diagnosis present

## 2017-11-26 DIAGNOSIS — O4202 Full-term premature rupture of membranes, onset of labor within 24 hours of rupture: Principal | ICD-10-CM | POA: Diagnosis present

## 2017-11-26 DIAGNOSIS — F329 Major depressive disorder, single episode, unspecified: Secondary | ICD-10-CM | POA: Diagnosis present

## 2017-11-26 DIAGNOSIS — R030 Elevated blood-pressure reading, without diagnosis of hypertension: Secondary | ICD-10-CM

## 2017-11-26 DIAGNOSIS — O99824 Streptococcus B carrier state complicating childbirth: Secondary | ICD-10-CM | POA: Diagnosis present

## 2017-11-26 DIAGNOSIS — O99344 Other mental disorders complicating childbirth: Secondary | ICD-10-CM | POA: Diagnosis present

## 2017-11-26 DIAGNOSIS — O4290 Premature rupture of membranes, unspecified as to length of time between rupture and onset of labor, unspecified weeks of gestation: Secondary | ICD-10-CM | POA: Diagnosis present

## 2017-11-26 LAB — URINALYSIS, ROUTINE W REFLEX MICROSCOPIC
BILIRUBIN URINE: NEGATIVE
Glucose, UA: NEGATIVE mg/dL
KETONES UR: 5 mg/dL — AB
LEUKOCYTES UA: NEGATIVE
NITRITE: NEGATIVE
PROTEIN: NEGATIVE mg/dL
Specific Gravity, Urine: 1.011 (ref 1.005–1.030)
pH: 6 (ref 5.0–8.0)

## 2017-11-26 LAB — TYPE AND SCREEN
ABO/RH(D): O POS
ANTIBODY SCREEN: NEGATIVE

## 2017-11-26 LAB — COMPREHENSIVE METABOLIC PANEL
ALK PHOS: 198 U/L — AB (ref 38–126)
ALT: 18 U/L (ref 14–54)
ANION GAP: 12 (ref 5–15)
AST: 26 U/L (ref 15–41)
Albumin: 3 g/dL — ABNORMAL LOW (ref 3.5–5.0)
BUN: 8 mg/dL (ref 6–20)
CALCIUM: 9 mg/dL (ref 8.9–10.3)
CO2: 19 mmol/L — AB (ref 22–32)
Chloride: 105 mmol/L (ref 101–111)
Creatinine, Ser: 0.4 mg/dL — ABNORMAL LOW (ref 0.44–1.00)
Glucose, Bld: 78 mg/dL (ref 65–99)
Potassium: 3.4 mmol/L — ABNORMAL LOW (ref 3.5–5.1)
SODIUM: 136 mmol/L (ref 135–145)
Total Bilirubin: 0.3 mg/dL (ref 0.3–1.2)
Total Protein: 6.5 g/dL (ref 6.5–8.1)

## 2017-11-26 LAB — PROTEIN / CREATININE RATIO, URINE: CREATININE, URINE: 37 mg/dL

## 2017-11-26 LAB — CBC WITH DIFFERENTIAL/PLATELET
BASOS PCT: 0 %
Basophils Absolute: 0 10*3/uL (ref 0.0–0.1)
EOS ABS: 0 10*3/uL (ref 0.0–0.7)
Eosinophils Relative: 0 %
HCT: 35.8 % — ABNORMAL LOW (ref 36.0–46.0)
HEMOGLOBIN: 12.2 g/dL (ref 12.0–15.0)
LYMPHS ABS: 1.9 10*3/uL (ref 0.7–4.0)
Lymphocytes Relative: 20 %
MCH: 29.8 pg (ref 26.0–34.0)
MCHC: 34.1 g/dL (ref 30.0–36.0)
MCV: 87.3 fL (ref 78.0–100.0)
MONO ABS: 0.4 10*3/uL (ref 0.1–1.0)
MONOS PCT: 4 %
NEUTROS PCT: 76 %
Neutro Abs: 7.2 10*3/uL (ref 1.7–7.7)
Platelets: 212 10*3/uL (ref 150–400)
RBC: 4.1 MIL/uL (ref 3.87–5.11)
RDW: 14.3 % (ref 11.5–15.5)
WBC: 9.6 10*3/uL (ref 4.0–10.5)

## 2017-11-26 LAB — ABO/RH: ABO/RH(D): O POS

## 2017-11-26 LAB — AMNISURE RUPTURE OF MEMBRANE (ROM) NOT AT ARMC: Amnisure ROM: POSITIVE

## 2017-11-26 LAB — POCT FERN TEST: POCT FERN TEST: POSITIVE

## 2017-11-26 LAB — URIC ACID: URIC ACID, SERUM: 4.6 mg/dL (ref 2.3–6.6)

## 2017-11-26 MED ORDER — EPHEDRINE 5 MG/ML INJ
10.0000 mg | INTRAVENOUS | Status: DC | PRN
  Filled 2017-11-26: qty 2

## 2017-11-26 MED ORDER — LACTATED RINGERS IV SOLN
INTRAVENOUS | Status: DC
  Administered 2017-11-26: 125 mL/h via INTRAVENOUS
  Administered 2017-11-27: via INTRAVENOUS

## 2017-11-26 MED ORDER — OXYCODONE-ACETAMINOPHEN 5-325 MG PO TABS: 2.0000 | ORAL_TABLET | ORAL | Status: DC | PRN

## 2017-11-26 MED ORDER — OXYTOCIN 40 UNITS IN LACTATED RINGERS INFUSION - SIMPLE MED
2.5000 [IU]/h | INTRAVENOUS | Status: DC
  Administered 2017-11-27: 2.5 [IU]/h via INTRAVENOUS
  Filled 2017-11-26: qty 1000

## 2017-11-26 MED ORDER — SOD CITRATE-CITRIC ACID 500-334 MG/5ML PO SOLN: 30.0000 mL | ORAL | Status: DC | PRN

## 2017-11-26 MED ORDER — OXYCODONE-ACETAMINOPHEN 5-325 MG PO TABS: 1.0000 | ORAL_TABLET | ORAL | Status: DC | PRN

## 2017-11-26 MED ORDER — OXYTOCIN BOLUS FROM INFUSION
500.0000 mL | Freq: Once | INTRAVENOUS | Status: AC
  Administered 2017-11-27: 500 mL via INTRAVENOUS

## 2017-11-26 MED ORDER — OXYTOCIN 40 UNITS IN LACTATED RINGERS INFUSION - SIMPLE MED
1.0000 m[IU]/min | INTRAVENOUS | Status: DC
  Administered 2017-11-26 – 2017-11-27 (×2): 2 m[IU]/min via INTRAVENOUS
  Filled 2017-11-26: qty 1000

## 2017-11-26 MED ORDER — PHENYLEPHRINE 40 MCG/ML (10ML) SYRINGE FOR IV PUSH (FOR BLOOD PRESSURE SUPPORT)
80.0000 ug | PREFILLED_SYRINGE | INTRAVENOUS | Status: DC | PRN
  Filled 2017-11-26: qty 5

## 2017-11-26 MED ORDER — DEXTROSE 5 % IV SOLN
5.0000 10*6.[IU] | Freq: Once | INTRAVENOUS | Status: AC
  Administered 2017-11-26: 5 10*6.[IU] via INTRAVENOUS
  Filled 2017-11-26: qty 5

## 2017-11-26 MED ORDER — LIDOCAINE HCL (PF) 1 % IJ SOLN
30.0000 mL | INTRAMUSCULAR | Status: DC | PRN
  Filled 2017-11-26: qty 30

## 2017-11-26 MED ORDER — PHENYLEPHRINE 40 MCG/ML (10ML) SYRINGE FOR IV PUSH (FOR BLOOD PRESSURE SUPPORT)
80.0000 ug | PREFILLED_SYRINGE | INTRAVENOUS | Status: DC | PRN
  Filled 2017-11-26: qty 5
  Filled 2017-11-26: qty 10

## 2017-11-26 MED ORDER — LACTATED RINGERS IV SOLN: 500.0000 mL | INTRAVENOUS | Status: DC | PRN

## 2017-11-26 MED ORDER — FENTANYL 2.5 MCG/ML BUPIVACAINE 1/10 % EPIDURAL INFUSION (WH - ANES)
14.0000 mL/h | INTRAMUSCULAR | Status: DC | PRN
  Administered 2017-11-26 – 2017-11-27 (×2): 14 mL/h via EPIDURAL
  Filled 2017-11-26 (×2): qty 100

## 2017-11-26 MED ORDER — PENICILLIN G POT IN DEXTROSE 60000 UNIT/ML IV SOLN
3.0000 10*6.[IU] | INTRAVENOUS | Status: DC
  Administered 2017-11-26 – 2017-11-27 (×3): 3 10*6.[IU] via INTRAVENOUS
  Filled 2017-11-26 (×5): qty 50

## 2017-11-26 MED ORDER — AMPHETAMINE-DEXTROAMPHETAMINE 10 MG PO TABS
20.0000 mg | ORAL_TABLET | Freq: Two times a day (BID) | ORAL | Status: DC
  Administered 2017-11-26 – 2017-11-27 (×2): 20 mg via ORAL
  Filled 2017-11-26 (×2): qty 1

## 2017-11-26 MED ORDER — ACETAMINOPHEN 325 MG PO TABS: 650.0000 mg | ORAL_TABLET | ORAL | Status: DC | PRN

## 2017-11-26 MED ORDER — DIPHENHYDRAMINE HCL 50 MG/ML IJ SOLN: 12.5000 mg | INTRAMUSCULAR | Status: DC | PRN

## 2017-11-26 MED ORDER — LIDOCAINE HCL (PF) 1 % IJ SOLN
INTRAMUSCULAR | Status: DC | PRN
  Administered 2017-11-26 (×2): 6 mL via EPIDURAL

## 2017-11-26 MED ORDER — AMPHETAMINE-DEXTROAMPHETAMINE 30 MG PO TABS: 30.0000 mg | ORAL_TABLET | Freq: Two times a day (BID) | ORAL | Status: DC

## 2017-11-26 MED ORDER — ONDANSETRON HCL 4 MG/2ML IJ SOLN: 4.0000 mg | Freq: Four times a day (QID) | INTRAMUSCULAR | Status: DC | PRN

## 2017-11-26 MED ORDER — LACTATED RINGERS IV SOLN
500.0000 mL | Freq: Once | INTRAVENOUS | Status: AC
  Administered 2017-11-26: 500 mL via INTRAVENOUS

## 2017-11-26 MED ORDER — PENICILLIN G POTASSIUM 5000000 UNITS IJ SOLR
2.5000 10*6.[IU] | INTRAMUSCULAR | Status: DC
  Filled 2017-11-26 (×2): qty 2.5

## 2017-11-26 MED ORDER — OXYTOCIN 10 UNIT/ML IJ SOLN: 10.0000 [IU] | Freq: Once | INTRAMUSCULAR | Status: DC

## 2017-11-26 MED ORDER — TERBUTALINE SULFATE 1 MG/ML IJ SOLN
0.2500 mg | Freq: Once | INTRAMUSCULAR | Status: DC | PRN
  Filled 2017-11-26: qty 1

## 2017-11-26 NOTE — Progress Notes (Addendum)
G1@ 39.[redacted] wksga. Presents to triage to r/o SROM. States felt leaking last night around 10 pm. And another leaking around 10 a.m. And "little bit around 11 or 2p.m.   Denies bleeding +FM  EFM applied  1500: Provider at bs assessing. Speculum exam and fern test done.  SVE: closed. pt denies feeling ctx.  1510: Amnisure done and sent to lab.   1518: fern test +   1535: two IV attempted and blew. Another nurse called to start IV.   3rd nurse called to start IV. Unsuccessful.   1546: Lab at bs drawing labs.

## 2017-11-26 NOTE — MAU Note (Signed)
Couple of gushes, first around 10 p.m. Last night.  Clear fluid, none in last couple hrs. No bleeding, not really contracting. First preg.

## 2017-11-26 NOTE — Anesthesia Procedure Notes (Signed)
Epidural Patient location during procedure: OB Start time: 11/26/2017 8:38 PM End time: 11/26/2017 8:41 PM  Staffing Anesthesiologist: Leilani AbleHatchett, Nahun Kronberg, MD Performed: anesthesiologist   Preanesthetic Checklist Completed: patient identified, surgical consent, pre-op evaluation, timeout performed, IV checked, risks and benefits discussed and monitors and equipment checked  Epidural Patient position: sitting Prep: site prepped and draped and DuraPrep Patient monitoring: continuous pulse ox and blood pressure Approach: midline Location: L3-L4 Injection technique: LOR air  Needle:  Needle type: Tuohy  Needle gauge: 17 G Needle length: 9 cm and 9 Needle insertion depth: 4 cm Catheter type: closed end flexible Catheter size: 19 Gauge Catheter at skin depth: 9 cm Test dose: negative and Other  Assessment Sensory level: T9 Events: blood not aspirated, injection not painful, no injection resistance, negative IV test and no paresthesia  Additional Notes Reason for block:procedure for pain

## 2017-11-26 NOTE — H&P (Signed)
OB ADMISSION/ HISTORY & PHYSICAL:  Admission Date: 11/26/2017  2:26 PM  Admit Diagnosis: Term pregnancy, PROM  Taylor [name] is a 36 y.o. female presenting for LOF since 2200 11/25/17. Intermittent small gushes, ROM confirmed in MAU + fern and amnisure. Denies HA/NV/RUQ pain/visual changes  Prenatal History: G1P0   EDC : 12/01/2017 [facility] since [redacted] weeks gestation  Prenatal course complicated by hx ETOH abuse, s/p rehab, smoker, ADD/ADHD, anxiety  Prenatal Labs: ABO, Rh:   O pos Antibody:  neg Rubella:   immune RPR:   neg HBsAg:   neg HIV:   neg GBS:   positive 1 hr Glucola : 104   Medical / Surgical History :  Past medical history:  Past Medical History:  Diagnosis Date  . Anemia   . Anxiety   . Depression    welbutrin - stopped taking it early pregn but will be back on it after     Past surgical history:  Past Surgical History:  Procedure Laterality Date  . KNEE SURGERY     right knee surgery 4 times - soccer     Family History:  Family History  Problem Relation Age of Onset  . Alcohol abuse Mother   . Drug abuse Mother   . Hypertension Mother   . Heart disease Mother   . Miscarriages / [place] Mother   . Arthritis Father   . Asthma Father   . Depression Father   . Hypertension Father   . Varicose Veins Father   . Asthma Sister   . Learning disabilities Sister   . Alcohol abuse Brother   . Depression Brother   . Drug abuse Brother   . Hypertension Brother   . Learning disabilities Brother   . Mental illness Brother   . Alcohol abuse Maternal Aunt   . Depression Maternal Aunt   . Drug abuse Maternal Aunt   . Mental illness Maternal Aunt   . Alcohol abuse Maternal Uncle   . Depression Paternal Aunt   . Depression Paternal Uncle   . Depression Maternal Grandmother   . Heart disease Maternal Grandmother   . Alcohol abuse Maternal Grandfather   . Depression Maternal Grandfather   . Depression Paternal  Grandmother   . Heart disease Paternal Grandmother   . Depression Paternal Grandfather      Social History:  reports that she has been smoking cigarettes.  She has a 6.00 pack-year smoking history. she has never used smokeless tobacco. She reports that she drinks about 3.0 oz of alcohol per week. She reports that she does not use drugs.   Allergies: Bee venom    Current Medications at time of admission:  Medications Prior to Admission  Medication Sig Dispense Refill Last Dose  . acetaminophen (TYLENOL) 500 MG tablet Take 1,000 mg by mouth every 6 (six) hours as needed for mild pain or headache.   11/26/2017 at Unknown time  . aluminum-magnesium hydroxide-simethicone (MAALOX) 200-200-20 MG/5ML SUSP Take 30 mLs by mouth 3 (three) times daily as needed (For acid reflux.).   11/25/2017 at Unknown time  . amphetamine-dextroamphetamine (ADDERALL) 20 MG tablet Take 20 mg by mouth 2 (two) times daily.   11/26/2017 at Unknown time  . Prenatal Vit-Fe Fumarate-FA (PRENATAL MULTIVITAMIN) TABS tablet Take 1 tablet by mouth daily at 12 noon.   11/26/2017 at Unknown time  . [DISCONTINUED] chlordiazePOXIDE (LIBRIUM) 25 MG capsule 50mg  PO TID x 1D, then 25-50mg  PO BID X 1D, then 25-50mg   PO QD X 1D (Patient not taking: Reported on 01/30/2017) 10 capsule 0 Unknown at Unknown time  . [DISCONTINUED] drospirenone-ethinyl estradiol (YAZ,GIANVI,LORYNA) 3-0.02 MG tablet Take 1 tablet by mouth daily. 1 Package 0 Unknown at Unknown time  . [DISCONTINUED] gabapentin (NEURONTIN) 100 MG capsule Take 200 mg by mouth at bedtime.  0 Unknown at Unknown time      Review of Systems: Review of Systems  Constitutional: Negative.   All other systems reviewed and are negative.   As noted above  Physical Exam:   Vitals:   11/26/17 1618 11/26/17 1633  BP: (!) 144/88 (!) 138/97  Pulse: 92 93  Resp: 18 18  Temp:    SpO2:      General: AAO x 3, anxious Heart: RRR Lungs:CTAB Abdomen: NT, gravid, vertex per  Leopold's Extremities: no edema Genitalia / VE: SSE, no pooling, cervix closed, no lesions, cvx 2/80/-2 FHR: 120's, mod var, + accels, no decels TOCO: irregular, mild, patient unaware  Labs:    CBC, CMP, T&S, uric acid, PCR pending  Assessment:  36 y.o. G1P0 at 1058w2d, PROM x 18 hrs, afebrile  1. Multiple psychoscocial issues s/p successful rehab 2. FHR category 1 3. GBS positive 4. Elevated BP, no severe range, no neural s/s 5. Hx alcohol abuse 6. Term SROM- no s/s chorio  Plan:  1. Admit to BS 2. Routine L&D orders 3. Analgesia/anesthesia PRN  4. Active management - plan cervical ripening as needed 5. PEC work-up- nl labs 6-Monitor s/s chorio   Neta Mendsaniela C Paul CNM, MSN 11/26/2017, 3:59 PM

## 2017-11-26 NOTE — Progress Notes (Signed)
Taylor [name] is a 36 y.o. G1P0 at 8638w2d by LMP admitted for rupture of membranes  Subjective: Comfortable NO headache, visual changes or epigastric paim  Objective: BP 138/90   Pulse 96   Temp 98.1 F (36.7 C) (Oral)   Resp 18   Ht 5\' 2"  (1.575 m)   Wt 71 kg (156 lb 8 oz)   SpO2 99%   BMI 28.62 kg/m  No intake/output data recorded. No intake/output data recorded.  FHT:  FHR: 125 bpm, variability: moderate,  accelerations:  Present,  decelerations:  Absent UC:   irregular, every 5-7 minutes SVE:   Dilation: 2 Effacement (%): 80 Station: -2 Exam by:: Colon Flattery. Paul, CNM  Labs: Lab Results  Component Value Date   WBC 9.6 11/26/2017   HGB 12.2 11/26/2017   HCT 35.8 (L) 11/26/2017   MCV 87.3 11/26/2017   PLT 212 11/26/2017   CMP     Component Value Date/Time   NA 136 11/26/2017 1555   K 3.4 (L) 11/26/2017 1555   CL 105 11/26/2017 1555   CO2 19 (L) 11/26/2017 1555   GLUCOSE 78 11/26/2017 1555   BUN 8 11/26/2017 1555   CREATININE 0.40 (L) 11/26/2017 1555   CALCIUM 9.0 11/26/2017 1555   PROT 6.5 11/26/2017 1555   ALBUMIN 3.0 (L) 11/26/2017 1555   AST 26 11/26/2017 1555   ALT 18 11/26/2017 1555   ALKPHOS 198 (H) 11/26/2017 1555   BILITOT 0.3 11/26/2017 1555   GFRNONAA >60 11/26/2017 1555   GFRAA >60 11/26/2017 1555     Assessment / Plan: Term SROM for augmentation GBS positive  Labor: Progressing normally Preeclampsia:  no signs or symptoms of toxicity, intake and ouput balanced and labs stable Fetal Wellbeing:  Category I Pain Control:  Labor support without medications I/D:  n/a Anticipated MOD:  NSVD  Taylor [name] J 11/26/2017, 7:44 PM

## 2017-11-26 NOTE — Anesthesia Preprocedure Evaluation (Signed)
Anesthesia Evaluation  Patient identified by MRN, date of birth, ID band Patient awake    Reviewed: Allergy & Precautions, H&P , NPO status , Patient's Chart, lab work & pertinent test results  Airway Mallampati: I  TM Distance: >3 FB Neck ROM: full    Dental no notable dental hx. (+) Teeth Intact   Pulmonary neg pulmonary ROS, Current Smoker,    Pulmonary exam normal breath sounds clear to auscultation       Cardiovascular negative cardio ROS Normal cardiovascular exam Rhythm:regular Rate:Normal     Neuro/Psych    GI/Hepatic negative GI ROS, Neg liver ROS,   Endo/Other  negative endocrine ROS  Renal/GU negative Renal ROS     Musculoskeletal negative musculoskeletal ROS (+)   Abdominal Normal abdominal exam  (+)   Peds  Hematology   Anesthesia Other Findings   Reproductive/Obstetrics (+) Pregnancy                             Anesthesia Physical Anesthesia Plan  ASA: II  Anesthesia Plan: Epidural   Post-op Pain Management:    Induction:   PONV Risk Score and Plan:   Airway Management Planned:   Additional Equipment:   Intra-op Plan:   Post-operative Plan:   Informed Consent: I have reviewed the patients History and Physical, chart, labs and discussed the procedure including the risks, benefits and alternatives for the proposed anesthesia with the patient or authorized representative who has indicated his/her understanding and acceptance.     Plan Discussed with:   Anesthesia Plan Comments:         Anesthesia Quick Evaluation

## 2017-11-27 ENCOUNTER — Encounter (HOSPITAL_COMMUNITY): Payer: Self-pay | Admitting: *Deleted

## 2017-11-27 DIAGNOSIS — O4290 Premature rupture of membranes, unspecified as to length of time between rupture and onset of labor, unspecified weeks of gestation: Secondary | ICD-10-CM | POA: Diagnosis present

## 2017-11-27 LAB — RPR: RPR Ser Ql: NONREACTIVE

## 2017-11-27 MED ORDER — ACETAMINOPHEN 325 MG PO TABS
650.0000 mg | ORAL_TABLET | ORAL | Status: DC | PRN
  Administered 2017-11-29: 650 mg via ORAL
  Filled 2017-11-27: qty 2

## 2017-11-27 MED ORDER — DIPHENHYDRAMINE HCL 25 MG PO CAPS: 25.0000 mg | ORAL_CAPSULE | Freq: Four times a day (QID) | ORAL | Status: DC | PRN

## 2017-11-27 MED ORDER — OXYCODONE-ACETAMINOPHEN 5-325 MG PO TABS
2.0000 | ORAL_TABLET | ORAL | Status: DC | PRN
  Administered 2017-11-27 – 2017-11-28 (×2): 2 via ORAL
  Filled 2017-11-27 (×2): qty 2

## 2017-11-27 MED ORDER — IBUPROFEN 600 MG PO TABS
600.0000 mg | ORAL_TABLET | Freq: Four times a day (QID) | ORAL | Status: DC
  Administered 2017-11-27 – 2017-11-28 (×4): 600 mg via ORAL
  Filled 2017-11-27 (×4): qty 1

## 2017-11-27 MED ORDER — SENNOSIDES-DOCUSATE SODIUM 8.6-50 MG PO TABS
2.0000 | ORAL_TABLET | ORAL | Status: DC
  Administered 2017-11-28 – 2017-11-29 (×2): 2 via ORAL
  Filled 2017-11-27 (×2): qty 2

## 2017-11-27 MED ORDER — WITCH HAZEL-GLYCERIN EX PADS: 1.0000 "application " | MEDICATED_PAD | CUTANEOUS | Status: DC | PRN

## 2017-11-27 MED ORDER — BUPROPION HCL ER (XL) 300 MG PO TB24
300.0000 mg | ORAL_TABLET | Freq: Every day | ORAL | Status: DC
  Administered 2017-11-27 – 2017-11-29 (×3): 300 mg via ORAL
  Filled 2017-11-27 (×4): qty 1

## 2017-11-27 MED ORDER — COCONUT OIL OIL
1.0000 "application " | TOPICAL_OIL | Status: DC | PRN
  Administered 2017-11-29: 1 via TOPICAL
  Filled 2017-11-27: qty 120

## 2017-11-27 MED ORDER — ONDANSETRON HCL 4 MG PO TABS: 4.0000 mg | ORAL_TABLET | ORAL | Status: DC | PRN

## 2017-11-27 MED ORDER — SIMETHICONE 80 MG PO CHEW: 80.0000 mg | CHEWABLE_TABLET | ORAL | Status: DC | PRN

## 2017-11-27 MED ORDER — METHYLERGONOVINE MALEATE 0.2 MG PO TABS: 0.2000 mg | ORAL_TABLET | ORAL | Status: DC | PRN

## 2017-11-27 MED ORDER — METHYLERGONOVINE MALEATE 0.2 MG/ML IJ SOLN: 0.2000 mg | INTRAMUSCULAR | Status: DC | PRN

## 2017-11-27 MED ORDER — ONDANSETRON HCL 4 MG/2ML IJ SOLN: 4.0000 mg | INTRAMUSCULAR | Status: DC | PRN

## 2017-11-27 MED ORDER — BENZOCAINE-MENTHOL 20-0.5 % EX AERO
1.0000 "application " | INHALATION_SPRAY | CUTANEOUS | Status: DC | PRN
  Administered 2017-11-27 – 2017-11-29 (×2): 1 via TOPICAL
  Filled 2017-11-27 (×2): qty 56

## 2017-11-27 MED ORDER — AMPHETAMINE-DEXTROAMPHETAMINE 10 MG PO TABS
20.0000 mg | ORAL_TABLET | Freq: Two times a day (BID) | ORAL | Status: DC
  Administered 2017-11-27 – 2017-11-29 (×4): 20 mg via ORAL
  Filled 2017-11-27 (×4): qty 2

## 2017-11-27 MED ORDER — DIBUCAINE 1 % RE OINT: 1.0000 "application " | TOPICAL_OINTMENT | RECTAL | Status: DC | PRN

## 2017-11-27 MED ORDER — OXYCODONE-ACETAMINOPHEN 5-325 MG PO TABS: 1.0000 | ORAL_TABLET | ORAL | Status: DC | PRN

## 2017-11-27 MED ORDER — TETANUS-DIPHTH-ACELL PERTUSSIS 5-2.5-18.5 LF-MCG/0.5 IM SUSP: 0.5000 mL | Freq: Once | INTRAMUSCULAR | Status: DC

## 2017-11-27 MED ORDER — ZOLPIDEM TARTRATE 5 MG PO TABS: 5.0000 mg | ORAL_TABLET | Freq: Every evening | ORAL | Status: DC | PRN

## 2017-11-27 MED ORDER — PRENATAL MULTIVITAMIN CH
1.0000 | ORAL_TABLET | Freq: Every day | ORAL | Status: DC
  Administered 2017-11-28 – 2017-11-29 (×2): 1 via ORAL
  Filled 2017-11-27 (×2): qty 1

## 2017-11-27 NOTE — Progress Notes (Signed)
Taylor [name] is a 36 y.o. G1P0 at 464w3d by LMP admitted for active labor, rupture of membranes  Subjective: Comfortable with epidural  Objective: BP 106/69   Pulse 81   Temp 98.1 F (36.7 C) (Oral)   Resp 18   Ht 5\' 2"  (1.575 m)   Wt 71 kg (156 lb 8 oz)   SpO2 98%   BMI 28.62 kg/m  No intake/output data recorded. Total I/O In: 1000 [Other:1000] Out: -   FHT:  FHR: 135 bpm, variability: moderate,  accelerations:  Present,  decelerations:  Absent UC:   irregular, every 2-5 minutes SVE:   Dilation: 10 Effacement (%): 100 Station: +2 Exam by:: L.Stubbs, RN  Labs: Lab Results  Component Value Date   WBC 9.6 11/26/2017   HGB 12.2 11/26/2017   HCT 35.8 (L) 11/26/2017   MCV 87.3 11/26/2017   PLT 212 11/26/2017    Assessment / Plan: Augmentation of labor, progressing well  Labor: Progressing normally Preeclampsia:  no signs or symptoms of toxicity and intake and ouput balanced Fetal Wellbeing:  Category I Pain Control:  Epidural I/D:  n/a Anticipated MOD:  NSVD  Taylor [name] 11/27/2017, 6:44 AM

## 2017-11-27 NOTE — Anesthesia Postprocedure Evaluation (Signed)
Anesthesia Post Note  Patient: Taylor [name]  Procedure(s) Performed: AN AD HOC LABOR EPIDURAL     Patient location during evaluation: Mother Baby Anesthesia Type: Epidural Level of consciousness: awake and alert Pain management: pain level controlled Vital Signs Assessment: post-procedure vital signs reviewed and stable Respiratory status: spontaneous breathing, nonlabored ventilation and respiratory function stable Cardiovascular status: stable Postop Assessment: no headache, no backache and epidural receding Anesthetic complications: no    Last Vitals:  Vitals:   11/27/17 1130 11/27/17 1235  BP: (!) 113/98 127/78  Pulse: 87 96  Resp: 18 20  Temp: 36.7 C 36.7 C  SpO2:      Last Pain:  Vitals:   11/27/17 1235  TempSrc: Oral  PainSc: 0-No pain   Pain Goal:                 EchoStarMERRITT,Kazuki Ingle

## 2017-11-28 LAB — CBC
HEMATOCRIT: 32 % — AB (ref 36.0–46.0)
Hemoglobin: 10.8 g/dL — ABNORMAL LOW (ref 12.0–15.0)
MCH: 29.7 pg (ref 26.0–34.0)
MCHC: 33.8 g/dL (ref 30.0–36.0)
MCV: 87.9 fL (ref 78.0–100.0)
Platelets: 182 10*3/uL (ref 150–400)
RBC: 3.64 MIL/uL — ABNORMAL LOW (ref 3.87–5.11)
RDW: 14.6 % (ref 11.5–15.5)
WBC: 11.2 10*3/uL — ABNORMAL HIGH (ref 4.0–10.5)

## 2017-11-28 MED ORDER — IBUPROFEN 800 MG PO TABS
800.0000 mg | ORAL_TABLET | Freq: Four times a day (QID) | ORAL | Status: DC
  Administered 2017-11-28 – 2017-11-29 (×5): 800 mg via ORAL
  Filled 2017-11-28 (×5): qty 1

## 2017-11-28 NOTE — Lactation Note (Signed)
This note was copied from a baby's chart. Lactation Consultation Note: Lactation brochure given with basic breastfeeding teaching done. Infant is 7528 hours old and has had 8 feedings with latch scores of 8-10. Mother breastfeeding infant in cradle hold with a shallow latch, when I arrived in the room. Mothers nipples are slightly pink and tender.  Assist mother with positioning infant in cross cradle and football holds. Observed infant with a sustained latch for 10-15 mins.  Mother advised to continue to cue base feed allow for cluster feeding and feed infant at least 8-12 times in 24 hours.  Mother was given a hand pump with instructions to use as needed. Advised mother to hand express colostrum before and after each feeding. Mother very sleepy during consult. Advised mother [facility] as [facility]. Mother informed of available LC services.   Patient Name: Taylor [name] [id number]'s Date: 11/28/2017 Reason for consult: Initial assessment   Maternal Data    Feeding Feeding Type: Breast Fed Length of feed: 10 min  LATCH Score Latch: Grasps breast easily, tongue down, lips flanged, rhythmical sucking.  Audible Swallowing: A few with stimulation  Type of Nipple: Everted at rest and after stimulation  Comfort (Breast/Nipple): Soft / non-tender  Hold (Positioning): Assistance needed to correctly position infant at breast and maintain latch.  LATCH Score: 8  Interventions Interventions: Assisted with latch  Lactation Tools Discussed/Used     Consult Status Consult Status: Follow-up Date: 11/29/17 Follow-up type: Out-patient    Stevan BornKendrick, Brendan [facility]well [facility] 11/28/2017, 3:26 PM

## 2017-11-28 NOTE — Progress Notes (Signed)
MOB was referred for history of depression/anxiety. * Referral screened out by Clinical Social Worker because none of the following criteria appear to apply: ~ History of anxiety/depression during this pregnancy, or of post-partum depression. ~ Diagnosis of anxiety and/or depression within last 3 years OR * MOB's symptoms currently being treated with medication and/or therapy. Per OB records  MOB has a psychiatrist that MOB meets with regularly.   Please contact the Clinical Social Worker if needs arise, by [facility] request, or if MOB scores greater than 9/yes to question 10 on Edinburgh Postpartum Depression Screen.  Blaine HamperAngel Boyd-Gilyard, MSW, LCSW Clinical Social Work [phone]

## 2017-11-28 NOTE — Progress Notes (Signed)
PPD 1 SVD  S:  Reports feeling lots of cramping             Tolerating po/ No nausea or vomiting             Bleeding is light             Pain controlled with motrin - helps some but still cramping and sore - requesting more pain medicine             Up ad lib / ambulatory / voiding QS  Newborn breast feeding  / Circumcision done O:               VS: BP 117/77 (BP Location: Left Arm)   Pulse 88   Temp 97.8 F (36.6 C) (Oral)   Resp 16   Ht 5\' 2"  (1.575 m)   Wt 71 kg (156 lb 8 oz)   SpO2 98%   Breastfeeding? Unknown   BMI 28.62 kg/m    LABS:              Recent Labs    11/26/17 1555 11/28/17 0528  WBC 9.6 11.2*  HGB 12.2 10.8*  PLT 212 182               Blood type: --/--/O POS, O POS (12/09 1555)  Rubella:   Immune             Flu and tdap current                   I&O: Intake/Output      12/10 0701 - 12/11 0700 12/11 0701 - 12/12 0700   Other     Total Intake(mL/kg)     Urine (mL/kg/hr) 600 (0.4)    Blood 300    Total Output 900    Net -900                     Physical Exam:             Alert and oriented X3  Abdomen: soft, non-tender, non-distended              Fundus: firm, non-tender, Ueven  Perineum: ice pack in place  Lochia: light to moderate amount  Extremities: no edema, no calf pain or tenderness    A: PPD # 1 SVD with 2nd degree repair  Doing well - stable status             HX alcohol abuse s/p rehab  P: Routine post partum orders  Increase Motrin dose - Percocet PRN (send home only Motrin) - encouraged use Motrin and Tylenol doses Marlinda MikeBAILEY, Daylen Hack CNM, MSN, [facility] 11/28/2017, 10:29 AM

## 2017-11-29 MED ORDER — IBUPROFEN 800 MG PO TABS: 800.0000 mg | ORAL_TABLET | Freq: Four times a day (QID) | ORAL | 0 refills | Status: DC

## 2017-11-29 MED ORDER — COCONUT OIL OIL: 1.0000 "application " | TOPICAL_OIL | 0 refills | Status: DC | PRN

## 2017-11-29 MED ORDER — BENZOCAINE-MENTHOL 20-0.5 % EX AERO: 1.0000 "application " | INHALATION_SPRAY | CUTANEOUS | Status: DC | PRN

## 2017-11-29 MED ORDER — BUPROPION HCL ER (XL) 300 MG PO TB24: 300.0000 mg | ORAL_TABLET | Freq: Every day | ORAL | 0 refills | Status: DC

## 2017-11-29 NOTE — Lactation Note (Addendum)
This note was copied from a baby's chart. Lactation Consultation Note  Patient Name: Taylor [name]: 11/29/2017 Reason for consult: Follow-up assessment   P1, Baby 50 hours old.  8219w3d < 6 lbs.  6.8% weight loss.   Mother is taking Adderall L3 and Wellbutrin L3.  [facility] to discuss with Pediatrician. Mother attempted breastfeeding but baby was sleepy and did not latch. Swaddled baby and provided hat. Mother states last feeding was 10 min. Discussed SGA feeding behavior and the importance of supplementing baby. Recommend spoon feeding at every feeding and using DEBP 4-6 times per day. Give volume pumped back to baby at next feeding. Suggest limiting feedings to 30 min. Reviewed cleaning and milk storage.  Finger syringe fed baby approx 6 ml. Then mother latched baby in cradle hold. Intermittent sucks and swallows observed. Encouraged mother to continue supplementing w/ breastmilk at each feeding.    Maternal Data Has patient been taught Hand Expression?: Yes Does the patient have breastfeeding experience prior to this delivery?: No  Feeding    LATCH Score                   Interventions    Lactation Tools Discussed/Used Pump Review: Setup, frequency, and cleaning;Milk Storage Initiated by:: Taylor Byesuth [name] RN IBCLC [name] initiated:: 11/29/17   Consult Status Consult Status: Follow-up [name]: 11/30/17 Follow-up type: In-patient    Taylor [name], Taylor [name] 11/29/2017, 11:42 AM

## 2017-11-29 NOTE — Discharge Summary (Signed)
Obstetric Discharge Summary Reason for Admission: onset of labor and rupture of membranes Prenatal Procedures: ultrasound and ETOH abuse, s/p rehab, ADD/ADHD, depression stable on meds Intrapartum Procedures: spontaneous vaginal delivery and epidural Postpartum Procedures: none Complications-Operative and Postpartum: 2nd degree perineal laceration Hemoglobin  Date Value Ref Range Status  11/28/2017 10.8 (L) 12.0 - 15.0 g/dL Final   HCT  Date Value Ref Range Status  11/28/2017 32.0 (L) 36.0 - 46.0 % Final    Physical Exam:  General: alert, cooperative and no distress Lochia: appropriate Uterine Fundus: firm Incision: healing well DVT Evaluation: No cords or calf tenderness. No significant calf/ankle edema.  Discharge Diagnoses: Term Pregnancy-delivered  Discharge Information: Date: 11/29/2017 Activity: pelvic rest Diet: routine Medications:  Allergies as of 11/29/2017      Reactions   Bee Venom Anaphylaxis      Medication List    TAKE these medications   acetaminophen 500 MG tablet Commonly known as:  TYLENOL Take 1,000 mg by mouth every 6 (six) hours as needed for mild pain or headache.   aluminum-magnesium hydroxide-simethicone 200-200-20 MG/5ML Susp Commonly known as:  MAALOX Take 30 mLs by mouth 3 (three) times daily as needed (For acid reflux.).   amphetamine-dextroamphetamine 20 MG tablet Commonly known as:  ADDERALL Take 20 mg by mouth 2 (two) times daily.   benzocaine-Menthol 20-0.5 % Aero Commonly known as:  DERMOPLAST Apply 1 application topically as needed for irritation (perineal discomfort).   buPROPion 300 MG 24 hr tablet Commonly known as:  WELLBUTRIN XL Take 1 tablet (300 mg total) by mouth daily.   coconut oil Oil Apply 1 application topically as needed.   ibuprofen 800 MG tablet Commonly known as:  ADVIL,MOTRIN Take 1 tablet (800 mg total) by mouth every 6 (six) hours.   prenatal multivitamin Tabs tablet Take 1 tablet by mouth daily  at 12 noon.            Discharge Care Instructions  (From admission, onward)        Start     Ordered   11/29/17 0000  Discharge wound care:    Comments:  Sitz baths 2 times /day with warm water x 1 week   11/29/17 1107     Condition: stable Instructions: refer to practice specific booklet Discharge to: boarding status Follow-up Information    Olivia Mackieaavon, Richard, MD. Schedule an appointment as soon as possible for a visit in 6 week(s).   Specialty:  Obstetrics and Gynecology Contact information: [place] [place] [address] [phone]           Newborn Data: Live born female Shon BatonBrooks Birth Weight: 5 lb 9.2 oz (2529 g) APGAR: 9, 9  Newborn Delivery   Birth date/time:  11/27/2017 08:45:00 Delivery type:  Vaginal, Spontaneous     Remains inpatient - weigh loss.  Neta MendsDaniela C Shane Melby, CNM 11/29/2017, 11:08 AM

## 2017-11-30 ENCOUNTER — Ambulatory Visit: Payer: Self-pay

## 2017-11-30 NOTE — Lactation Note (Signed)
This note was copied from a baby's chart. Lactation Consultation Note  Patient Name: Taylor Illene SilverLeigh Chermak WUJWJ'XToday's Date: 11/30/2017 Reason for consult: Follow-up assessment   Baby < 6 lbs. Mother followed feeding plan and baby gained weight last night. Praised mother for her efforts. Mother's breasts are filling, slightly engorged.  Provided mother w/ ice packs. Encouraged mother to post pump approx 4 times per day and give volume back to baby at next feeding. Reviewed milk storage. Mother has DEBP at home. Reviewed engorgement care and monitoring voids/stools. No further questions or concerns at this time.   Maternal Data    Feeding Length of feed: 15 min  LATCH Score                   Interventions    Lactation Tools Discussed/Used     Consult Status Consult Status: Complete    Hardie PulleyBerkelhammer, Ruth [name] 11/30/2017, 9:25 AM

## 2018-09-25 ENCOUNTER — Other Ambulatory Visit: Payer: Self-pay | Admitting: Psychiatry

## 2018-09-25 ENCOUNTER — Telehealth: Payer: Self-pay | Admitting: Psychiatry

## 2018-09-25 DIAGNOSIS — F902 Attention-deficit hyperactivity disorder, combined type: Secondary | ICD-10-CM

## 2018-09-25 MED ORDER — AMPHETAMINE-DEXTROAMPHETAMINE 20 MG PO TABS: 20.0000 mg | ORAL_TABLET | Freq: Two times a day (BID) | ORAL | 0 refills | Status: DC

## 2018-09-25 MED ORDER — BUPROPION HCL ER (XL) 150 MG PO TB24: 150.0000 mg | ORAL_TABLET | Freq: Every day | ORAL | 4 refills | Status: DC
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
## 2018-09-25 NOTE — Telephone Encounter (Signed)
Replacement prescriptions are completed as requested as entered into epic note.

## 2018-10-17 ENCOUNTER — Telehealth: Payer: Self-pay | Admitting: Psychiatry

## 2018-10-17 DIAGNOSIS — F902 Attention-deficit hyperactivity disorder, combined type: Secondary | ICD-10-CM

## 2018-10-17 MED ORDER — AMPHETAMINE-DEXTROAMPHETAMINE 20 MG PO TABS
20.0000 mg | ORAL_TABLET | Freq: Two times a day (BID) | ORAL | 0 refills | Status: DC
Start: 2018-10-17 — End: 2018-11-13

## 2018-10-17 NOTE — Telephone Encounter (Signed)
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  He is having more success with [facility] relative to her planned stay in [facility].  I declined to write a written prescription that mother can pick up or that she can get early, but I do agree to send the Adderall 20 mg IR twice daily [facility] on [address] to assure that she will have access needed.

## 2018-11-13 ENCOUNTER — Telehealth: Payer: Self-pay | Admitting: Psychiatry

## 2018-11-13 DIAGNOSIS — F902 Attention-deficit hyperactivity disorder, combined type: Secondary | ICD-10-CM

## 2018-11-13 MED ORDER — AMPHETAMINE-DEXTROAMPHETAMINE 20 MG PO TABS: 20.0000 mg | ORAL_TABLET | Freq: Two times a day (BID) | ORAL | 0 refills | Status: DC

## 2018-11-13 NOTE — Telephone Encounter (Signed)
Medically necessary Adderall 20 mg IR tablet twice daily #60 no [facility] [address] no contraindication found.

## 2018-12-03 ENCOUNTER — Encounter: Payer: Self-pay | Admitting: Psychiatry

## 2018-12-03 ENCOUNTER — Ambulatory Visit (INDEPENDENT_AMBULATORY_CARE_PROVIDER_SITE_OTHER): Payer: BLUE CROSS/BLUE SHIELD | Admitting: Psychiatry

## 2018-12-03 VITALS — BP 108/82 | HR 88 | Ht 63.0 in | Wt 137.0 lb

## 2018-12-03 DIAGNOSIS — F902 Attention-deficit hyperactivity disorder, combined type: Secondary | ICD-10-CM

## 2018-12-03 DIAGNOSIS — F411 Generalized anxiety disorder: Secondary | ICD-10-CM

## 2018-12-03 DIAGNOSIS — F3281 Premenstrual dysphoric disorder: Secondary | ICD-10-CM

## 2018-12-03 DIAGNOSIS — F1021 Alcohol dependence, in remission: Secondary | ICD-10-CM | POA: Diagnosis not present

## 2018-12-03 MED ORDER — BUPROPION HCL ER (XL) 300 MG PO TB24: 300.0000 mg | ORAL_TABLET | Freq: Every day | ORAL | 5 refills | Status: DC

## 2018-12-03 MED ORDER — AMPHETAMINE-DEXTROAMPHETAMINE 20 MG PO TABS: 20.0000 mg | ORAL_TABLET | Freq: Two times a day (BID) | ORAL | 0 refills | Status: DC

## 2018-12-03 MED ORDER — AMPHETAMINE-DEXTROAMPHETAMINE 20 MG PO TABS
20.0000 mg | ORAL_TABLET | Freq: Two times a day (BID) | ORAL | 0 refills | Status: DC
Start: 2018-12-06 — End: 2019-01-03
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
## 2019-01-03 ENCOUNTER — Telehealth: Payer: Self-pay | Admitting: Psychiatry

## 2019-01-03 DIAGNOSIS — F902 Attention-deficit hyperactivity disorder, combined type: Secondary | ICD-10-CM

## 2019-01-03 MED ORDER — AMPHETAMINE-DEXTROAMPHETAMINE 20 MG PO TABS: 20.0000 mg | ORAL_TABLET | Freq: Two times a day (BID) | ORAL | 0 refills | Status: DC

## 2019-01-03 NOTE — Telephone Encounter (Signed)
Pt requests RF of Adderall 20mg  to [facility] [address]. Appt in June

## 2019-01-03 NOTE — Telephone Encounter (Signed)
At last appointment 12/03/2018, patient had out-of-state travel planned so that prescriptions were sent to [facility] on [address] for fills early on 12/06/2018 which registry of controlled substance confirms was filled, and then fills available for 01/12/2019 and 02/11/2019 so that she does not need another escription sent  this month or next.

## 2019-01-03 NOTE — Telephone Encounter (Signed)
Pt states she is out and the next available RX is not due until Jan 25.( I told her that she filled the Dec script early. ) I suppose she is asking for another early refill this month due to being out.
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
## 2019-01-11 ENCOUNTER — Telehealth: Payer: Self-pay | Admitting: Psychiatry

## 2019-01-11 NOTE — Telephone Encounter (Signed)
Spoke with pharmacist and they will fill today. Pt notified

## 2019-01-11 NOTE — Telephone Encounter (Signed)
Adderall date to refill is tomorrow (01/12/2019). Pt called to ask for early refill due to emergency family issue. Need to leave ASAP. [facility]. Please advise pt status.

## 2019-01-29 ENCOUNTER — Ambulatory Visit: Payer: BLUE CROSS/BLUE SHIELD | Admitting: Psychiatry

## 2019-02-04 ENCOUNTER — Telehealth: Payer: Self-pay | Admitting: Psychiatry

## 2019-02-04 DIAGNOSIS — F902 Attention-deficit hyperactivity disorder, combined type: Secondary | ICD-10-CM

## 2019-02-04 MED ORDER — AMPHETAMINE-DEXTROAMPHETAMINE 20 MG PO TABS: 20.0000 mg | ORAL_TABLET | Freq: Two times a day (BID) | ORAL | 0 refills | Status: DC

## 2019-02-04 MED ORDER — BUPROPION HCL ER (XL) 300 MG PO TB24: 300.0000 mg | ORAL_TABLET | Freq: Every day | ORAL | 0 refills | Status: DC

## 2019-02-04 NOTE — Telephone Encounter (Signed)
Patient coordinates with office that mother has taken patient's car to [place] in which she had her lock box containing Adderall and Wellbutrin needing 5-day supply to cover until mother returns that pharmacy could delay next prescription from 02/11/2019 5 days.  Adderall 20 mg IR tablet twice daily #10 and Wellbutrin 300 mg XL every morning #5 [facility] [address] patient's schedule and environment disrupted by husband's addiction consequences to resume monthly prescribing after next fill of Adderall with no contraindication except chaos of her daily life as she attempts to work from home establishing medical necessity.

## 2019-02-04 NOTE — Telephone Encounter (Signed)
Patient called and said her medicine is in a lock box in her car. Her mom took the car to [facility] so she needs to have five days worth of her adderrall 20 mg 2 x day and wellbutrin 350 mg 1x a day. [facility] [employer] [employer] [facility] [address]

## 2019-02-05 ENCOUNTER — Emergency Department (HOSPITAL_COMMUNITY): Payer: BLUE CROSS/BLUE SHIELD

## 2019-02-05 ENCOUNTER — Encounter (HOSPITAL_COMMUNITY): Payer: Self-pay | Admitting: Internal Medicine

## 2019-02-05 ENCOUNTER — Emergency Department (HOSPITAL_COMMUNITY)
Admission: EM | Admit: 2019-02-05 | Discharge: 2019-02-05 | Disposition: A | Discharge status: Home / Self Care | Payer: BLUE CROSS/BLUE SHIELD | Attending: Emergency Medicine | Admitting: Emergency Medicine

## 2019-02-05 ENCOUNTER — Encounter: Payer: Self-pay | Admitting: Emergency Medicine

## 2019-02-05 DIAGNOSIS — Z79899 Other long term (current) drug therapy: Secondary | ICD-10-CM | POA: Insufficient documentation

## 2019-02-05 DIAGNOSIS — R1031 Right lower quadrant pain: Secondary | ICD-10-CM | POA: Insufficient documentation

## 2019-02-05 DIAGNOSIS — R197 Diarrhea, unspecified: Secondary | ICD-10-CM | POA: Insufficient documentation

## 2019-02-05 DIAGNOSIS — F17210 Nicotine dependence, cigarettes, uncomplicated: Secondary | ICD-10-CM | POA: Insufficient documentation

## 2019-02-05 LAB — LIPASE, BLOOD: LIPASE: 25 U/L (ref 11–51)

## 2019-02-05 LAB — COMPREHENSIVE METABOLIC PANEL
ALT: 27 U/L (ref 0–44)
AST: 43 U/L — ABNORMAL HIGH (ref 15–41)
Albumin: 3.8 g/dL (ref 3.5–5.0)
Alkaline Phosphatase: 49 U/L (ref 38–126)
Anion gap: 12 (ref 5–15)
BILIRUBIN TOTAL: 2 mg/dL — AB (ref 0.3–1.2)
BUN: 11 mg/dL (ref 6–20)
CALCIUM: 9.1 mg/dL (ref 8.9–10.3)
CO2: 20 mmol/L — ABNORMAL LOW (ref 22–32)
Chloride: 103 mmol/L (ref 98–111)
Creatinine, Ser: 0.86 mg/dL (ref 0.44–1.00)
Glucose, Bld: 102 mg/dL — ABNORMAL HIGH (ref 70–99)
POTASSIUM: 4.8 mmol/L (ref 3.5–5.1)
Sodium: 135 mmol/L (ref 135–145)
TOTAL PROTEIN: 7.1 g/dL (ref 6.5–8.1)

## 2019-02-05 LAB — CBC
HCT: 47 % — ABNORMAL HIGH (ref 36.0–46.0)
Hemoglobin: 15.3 g/dL — ABNORMAL HIGH (ref 12.0–15.0)
MCH: 29.7 pg (ref 26.0–34.0)
MCHC: 32.6 g/dL (ref 30.0–36.0)
MCV: 91.1 fL (ref 80.0–100.0)
PLATELETS: 266 10*3/uL (ref 150–400)
RBC: 5.16 MIL/uL — ABNORMAL HIGH (ref 3.87–5.11)
RDW: 13.6 % (ref 11.5–15.5)
WBC: 10.2 10*3/uL (ref 4.0–10.5)
nRBC: 0 % (ref 0.0–0.2)

## 2019-02-05 LAB — URINALYSIS, ROUTINE W REFLEX MICROSCOPIC
BILIRUBIN URINE: NEGATIVE
Glucose, UA: NEGATIVE mg/dL
HGB URINE DIPSTICK: NEGATIVE
Ketones, ur: NEGATIVE mg/dL
Leukocytes,Ua: NEGATIVE
NITRITE: NEGATIVE
PROTEIN: NEGATIVE mg/dL
SPECIFIC GRAVITY, URINE: 1.023 (ref 1.005–1.030)
pH: 6 (ref 5.0–8.0)

## 2019-02-05 LAB — I-STAT BETA HCG BLOOD, ED (MC, WL, AP ONLY)

## 2019-02-05 MED ORDER — SODIUM CHLORIDE 0.9% FLUSH: 3.0000 mL | Freq: Once | INTRAVENOUS | Status: DC

## 2019-02-05 MED ORDER — IOPAMIDOL (ISOVUE-300) INJECTION 61%
100.0000 mL | Freq: Once | INTRAVENOUS | Status: AC | PRN
  Administered 2019-02-05: 100 mL via INTRAVENOUS

## 2019-02-05 MED ORDER — SODIUM CHLORIDE 0.9 % IV BOLUS
1000.0000 mL | Freq: Once | INTRAVENOUS | Status: AC
  Administered 2019-02-05: 1000 mL via INTRAVENOUS

## 2019-02-05 MED ORDER — ONDANSETRON HCL 4 MG/2ML IJ SOLN: 4.0000 mg | Freq: Once | INTRAMUSCULAR | Status: DC

## 2019-02-05 MED ORDER — ONDANSETRON HCL 4 MG PO TABS: 4.0000 mg | ORAL_TABLET | Freq: Four times a day (QID) | ORAL | 0 refills | Status: DC

## 2019-02-05 MED ORDER — MORPHINE SULFATE (PF) 4 MG/ML IV SOLN
4.0000 mg | Freq: Once | INTRAVENOUS | Status: AC
  Administered 2019-02-05: 4 mg via INTRAVENOUS
  Filled 2019-02-05: qty 1

## 2019-02-05 NOTE — ED Notes (Signed)
ED Provider at bedside. 

## 2019-02-05 NOTE — ED Notes (Signed)
Patient verbalizes understanding of discharge instructions. Opportunity for questioning and answers were provided. Armband removed by staff, pt discharged from ED ambulatory.   

## 2019-02-05 NOTE — Discharge Instructions (Addendum)
Drink plenty fluids, take Tylenol or Motrin as needed for pain.  Take Zofran as needed for nausea.  Return to the ED immediately for new or worsening symptoms or concerns, such as new or worsening abdominal pain, return of your pain, vomiting, fevers or vaginal bleeding or any concerns at all.

## 2019-02-05 NOTE — ED Triage Notes (Addendum)
Pt here from home c/o RLQ abdominal pain that began this morning upon waking. Reports episode of diarrhea yesterday. Denies nausea and hx of kidney stones/recent illness.

## 2019-02-05 NOTE — ED Provider Notes (Signed)
[facility] EMERGENCY DEPARTMENT Provider Note   CSN: [id number] Arrival date & time: 02/05/19  1103    History   Chief Complaint Chief Complaint  Patient presents with  . Abdominal Pain    HPI [place] [name] is a 38 y.o. female.     HPI   38 year old female, with no significant medical history, presents with a 1 day history of abdominal pain.  Patient states abdominal pain started this morning in the right lower quadrant, nonradiating.  She describes pain as a sharp intermittent pain with a dull ache in the middle.  She notes that she had approximately 8 episodes of diarrhea in the last 24 hours.  She denies any nausea, vomiting, hematemesis, hematochezia, melena.  She denies any fevers, chest pain, shortness of breath.  Patient denies any dysuria, urinary urgency, urinary frequency.  She denies any vaginal bleeding, vaginal discharge, new sexual partners, known exposures to STDs.  Past Medical History:  Diagnosis Date  . Anemia   . Anxiety   . Depression    welbutrin - stopped taking it early pregn but will be back on it after    Patient Active Problem List   Diagnosis Date Noted  . Delayed delivery after SROM (spontaneous rupture of membranes) 11/27/2017  . SVD (spontaneous vaginal delivery) 11/27/2017  . Postpartum care following vaginal delivery (12/10) 11/27/2017  . Second-degree perineal laceration, with delivery 11/27/2017  . PROM (premature rupture of membranes) 11/26/2017  . Generalized anxiety disorder 01/30/2017  . Adderall use disorder, mild, abuse (HCC) 06/27/2016  . Convulsions/seizures--[facility] 07/2015--etoh!! 08/01/2015  . Alcohol use disorder, severe, in early remission, dependence (HCC) 08/01/2015  . PMDD (premenstrual dysphoric disorder) 10/16/2013  . Attention deficit hyperactivity disorder (ADHD), combined type, severe 05/08/2013  . Depression, reactive 05/08/2013    Past Surgical History:  Procedure Laterality Date  . KNEE  SURGERY     right knee surgery 4 times - soccer     OB History    Gravida  1   Para  1   Term  1   Preterm      AB      Living  1     SAB      TAB      Ectopic      Multiple  0   Live Births  1            Home Medications    Prior to Admission medications   Medication Sig Start Date End Date Taking? Authorizing Provider  acetaminophen (TYLENOL) 500 MG tablet Take 1,000 mg by mouth every 6 (six) hours as needed for mild pain or headache.    [provider]  aluminum-magnesium hydroxide-simethicone (MAALOX) 200-200-20 MG/5ML SUSP Take 30 mLs by mouth 3 (three) times daily as needed (For acid reflux.).    [provider]  amphetamine-dextroamphetamine (ADDERALL) 20 MG tablet Take 1 tablet (20 mg total) by mouth 2 (two) times daily. 01/12/19 02/11/19  Chauncey Mann, MD  amphetamine-dextroamphetamine (ADDERALL) 20 MG tablet Take 1 tablet (20 mg total) by mouth 2 (two) times daily. 02/11/19 03/13/19  Chauncey Mann, MD  amphetamine-dextroamphetamine (ADDERALL) 20 MG tablet Take 1 tablet (20 mg total) by mouth 2 (two) times daily for 5 days. 02/04/19 02/09/19  Chauncey Mann, MD  benzocaine-Menthol (DERMOPLAST) 20-0.5 % AERO Apply 1 application topically as needed for irritation (perineal discomfort). 11/29/17   Neta Mends, CNM  buPROPion (WELLBUTRIN XL) 300 MG 24 hr tablet Take  1 tablet (300 mg total) by mouth daily. 02/04/19   Chauncey MannJennings, Glenn E, MD  coconut oil OIL Apply 1 application topically as needed. 11/29/17   Neta MendsPaul, Daniela C, CNM  ibuprofen (ADVIL,MOTRIN) 800 MG tablet Take 1 tablet (800 mg total) by mouth every 6 (six) hours. 11/29/17   Neta MendsPaul, Daniela C, CNM  Prenatal Vit-Fe Fumarate-FA (PRENATAL MULTIVITAMIN) TABS tablet Take 1 tablet by mouth daily at 12 noon.    [provider]    Family History Family History  Problem Relation Age of Onset  . Alcohol abuse Mother   . Drug abuse Mother   . Hypertension Mother   . Heart  disease Mother   . Miscarriages / [place] Mother   . Arthritis Father   . Asthma Father   . Depression Father   . Hypertension Father   . Varicose Veins Father   . Asthma Sister   . Learning disabilities Sister   . Alcohol abuse Brother   . Depression Brother   . Drug abuse Brother   . Hypertension Brother   . Learning disabilities Brother   . Mental illness Brother   . Alcohol abuse Maternal Aunt   . Depression Maternal Aunt   . Drug abuse Maternal Aunt   . Mental illness Maternal Aunt   . Alcohol abuse Maternal Uncle   . Depression Paternal Aunt   . Depression Paternal Uncle   . Depression Maternal Grandmother   . Heart disease Maternal Grandmother   . Alcohol abuse Maternal Grandfather   . Depression Maternal Grandfather   . Depression Paternal Grandmother   . Heart disease Paternal Grandmother   . Depression Paternal Grandfather     Social History Social History   Tobacco Use  . Smoking status: Current Some Day Smoker    Packs/day: 1.50    Years: 4.00    Pack years: 6.00    Types: Cigarettes  . Smokeless tobacco: Never Used  . Tobacco comment: cutting back   Substance Use Topics  . Alcohol use: Yes    Alcohol/week: 6.0 standard drinks    Types: 6 Standard drinks or equivalent per week    Comment: past 5 years vodka and beer  . Drug use: No     Allergies   Bee venom   Review of Systems Review of Systems  Constitutional: Negative for chills and fever.  HENT: Negative for rhinorrhea and sore throat.   Eyes: Negative for visual disturbance.  Respiratory: Negative for cough and shortness of breath.   Cardiovascular: Negative for chest pain and leg swelling.  Gastrointestinal: Positive for abdominal pain and diarrhea. Negative for nausea and vomiting.  Genitourinary: Negative for dysuria, frequency, urgency, vaginal bleeding and vaginal discharge.  Skin: Negative for rash and wound.  Neurological: Negative for syncope.  All other systems reviewed  and are negative.    Physical Exam Updated Vital Signs BP 102/63 (BP Location: Right Arm)   Pulse 100   Temp 98.2 F (36.8 C)   Resp 14   SpO2 97%   Physical Exam Vitals signs and nursing note reviewed.  Constitutional:      Appearance: She is well-developed.  HENT:     Head: Normocephalic and atraumatic.  Eyes:     Conjunctiva/sclera: Conjunctivae normal.  Neck:     Musculoskeletal: Neck supple.  Cardiovascular:     Rate and Rhythm: Normal rate and regular rhythm.     Heart sounds: Normal heart sounds. No murmur.  Pulmonary:     Effort: Pulmonary  effort is normal. No respiratory distress.     Breath sounds: Normal breath sounds. No wheezing or rales.  Abdominal:     General: Bowel sounds are normal. There is no distension.     Palpations: Abdomen is soft.     Tenderness: There is abdominal tenderness in the right lower quadrant. There is no guarding or rebound. Positive signs include McBurney's sign.  Musculoskeletal: Normal range of motion.        General: No tenderness or deformity.  Skin:    General: Skin is warm and dry.     Findings: No erythema or rash.  Neurological:     Mental Status: She is alert and oriented to person, place, and time.  Psychiatric:        Behavior: Behavior normal.      ED Treatments / Results  Labs (all labs ordered are listed, but only abnormal results are displayed) Labs Reviewed  COMPREHENSIVE METABOLIC PANEL - Abnormal; Notable for the following components:      Result Value   CO2 20 (*)    Glucose, Bld 102 (*)    AST 43 (*)    Total Bilirubin 2.0 (*)    All other components within normal limits  CBC - Abnormal; Notable for the following components:   RBC 5.16 (*)    Hemoglobin 15.3 (*)    HCT 47.0 (*)    All other components within normal limits  URINALYSIS, ROUTINE W REFLEX MICROSCOPIC - Abnormal; Notable for the following components:   Color, Urine STRAW (*)    All other components within normal limits  LIPASE,  BLOOD  I-STAT BETA HCG BLOOD, ED ([facility], [facility], [facility] ONLY)    EKG None  Radiology Ct Abdomen Pelvis W Contrast  Result Date: 02/05/2019 CLINICAL DATA:  Abdominal pain EXAM: CT ABDOMEN AND PELVIS WITH CONTRAST TECHNIQUE: Multidetector CT imaging of the abdomen and pelvis was performed using the standard protocol following bolus administration of intravenous contrast. CONTRAST:  100mL ISOVUE-300 IOPAMIDOL (ISOVUE-300) INJECTION 61% COMPARISON:  None. FINDINGS: Lower chest: Lung bases are clear. Hepatobiliary: No focal liver lesions are evident. Gallbladder wall is not appreciably thickened. There is no biliary duct dilatation. Pancreas: No pancreatic mass or inflammatory focus. Spleen: No splenic lesions are evident. Adrenals/Urinary Tract: Adrenals bilaterally appear unremarkable. There is a 5 mm cyst in the mid left kidney. There is no appreciable hydronephrosis on either side. There is no appreciable renal or ureteral calculus on either side. Urinary bladder is midline with wall thickness within normal limits. Stomach/Bowel: There is fluid throughout most of the loops of colon. There is also fluid throughout much of the small bowel. There is no appreciable bowel wall or mesenteric thickening. There is no focal bowel obstruction. There is no appreciable free air or portal venous air. Vascular/Lymphatic: There is no abdominal aortic aneurysm. No vascular lesions are evident. There is no evident adenopathy in the abdomen or pelvis. Reproductive: Uterus is in the midline. No pelvic mass is demonstrable. Other: Appendix appears normal. There is no abscess or ascites in the abdomen or pelvis. Musculoskeletal: There are no blastic or lytic bone lesions. There is no intramuscular or abdominal wall lesion evident. IMPRESSION: 1. Most bowel loops are fluid-filled. Question a degree of enteritis or ileus. No bowel obstruction evident. No bowel wall thickening appreciable. 2. Appendix appears normal. No evident abscess in  the abdomen or pelvis. 3. No evident renal or ureteral calculus. No hydronephrosis. Urinary bladder wall thickness within normal limits. Electronically Signed  By: Bretta Bang III M.D.   On: 02/05/2019 11:05    Procedures Procedures (including critical care time)  Medications Ordered in ED Medications  sodium chloride flush (NS) 0.9 % injection 3 mL (has no administration in time range)  ondansetron (ZOFRAN) injection 4 mg (has no administration in time range)  sodium chloride 0.9 % bolus 1,000 mL (0 mLs Intravenous Stopped 02/05/19 1042)  morphine 4 MG/ML injection 4 mg (4 mg Intravenous Given 02/05/19 0929)  iopamidol (ISOVUE-300) 61 % injection 100 mL (100 mLs Intravenous Contrast Given 02/05/19 1056)     Initial Impression / Assessment and Plan / ED Course  I have reviewed the triage vital signs and the nursing notes.  Pertinent labs & imaging results that were available during my care of the patient were reviewed by me and considered in my medical decision making (see chart for details).  Clinical Course as of Feb 05 1142  Tue Feb 05, 2019  1121 Comfortably in bed, no acute distress, nontoxic, non-lethargic.  She states her pain has gradually improved since onset this morning.  She states pain is significantly better at this time.  Her abdomen is soft and nontender to palpation.   [CK]    Clinical Course User Index [CK] Kresta Templeman S, PA-C       Presented with abdominal pain, sudden onset.  She notes yesterday she had diarrhea approximately 8 episodes.  She denies any nausea or vomiting.  She states pain was gradually improving over the last several hours.  She is resting comfortably in bed at this time and denies any abdominal pain.  Her abdomen is soft and nontender to palpation.  Her blood work is unremarkable, urine shows no UTI.  She is not pregnant.  Abdominal CT shows no appendicitis, no obstruction, no perforation. Unlikely ovarian torsion given improvement of  pain prior to pain medication and no pain currently. Pt has no obvious ovarian cysts on CT. Offered further evaluation with pelvic exam to evaluate for adnexal tenderness and STDs.  Patient declined further evaluation.  She was given strict return precautions.  She is requesting a prescription for Zofran for home.  Vital signs stable.  She is ready and stable for discharge.   At this time there does not appear to be any evidence of an acute emergency medical condition and the patient appears stable for discharge with appropriate outpatient follow up.Diagnosis was discussed with patient who verbalizes understanding and is agreeable to discharge.   Final Clinical Impressions(s) / ED Diagnoses   Final diagnoses:  None    ED Discharge Orders    None       Clayborne Artist, PA-C 02/05/19 1454    Vanetta Mulders, MD 02/06/19 1301

## 2019-02-12 ENCOUNTER — Other Ambulatory Visit: Payer: Self-pay | Admitting: Psychiatry

## 2019-02-12 DIAGNOSIS — F902 Attention-deficit hyperactivity disorder, combined type: Secondary | ICD-10-CM

## 2019-02-12 MED ORDER — AMPHETAMINE-DEXTROAMPHETAMINE 20 MG PO TABS: 20.0000 mg | ORAL_TABLET | Freq: Two times a day (BID) | ORAL | 0 refills | Status: DC

## 2019-02-12 NOTE — Telephone Encounter (Signed)
Arlet called to report that the pharmacy will not fill her rx adderall because they have a discrepancy with the prescription and needs the Dr. To call them to clarify.  Nino is leaving town in a couple of hours and needs to pick up med before she leaves.  Please the pharmary - [facility] [address]

## 2019-02-12 NOTE — Telephone Encounter (Signed)
Patient did fill the 10 Adderall 20 mg IR from 02/04/2019 to cover supply [place] mother.  Patient is leaving town on business again today and needs her 12/03/2018 E scription for 02/11/2018 not present on the pharmacy computer at [facility] any longer possibly expunged by the interim 10 therefore resent today that #60 not filled for 02/11/2019 according to [facility] registry in [place], [place], or [place].

## 2019-03-06 ENCOUNTER — Telehealth: Payer: Self-pay | Admitting: Psychiatry

## 2019-03-06 DIAGNOSIS — F902 Attention-deficit hyperactivity disorder, combined type: Secondary | ICD-10-CM

## 2019-03-06 MED ORDER — BUPROPION HCL ER (XL) 300 MG PO TB24: 300.0000 mg | ORAL_TABLET | Freq: Every day | ORAL | 0 refills | Status: DC

## 2019-03-06 MED ORDER — AMPHETAMINE-DEXTROAMPHETAMINE 20 MG PO TABS: 20.0000 mg | ORAL_TABLET | Freq: Two times a day (BID) | ORAL | 0 refills | Status: DC

## 2019-03-06 NOTE — Telephone Encounter (Signed)
Phone review with patient requesting Wellbutrin and Adderall to [facility] in [place] as she clarifies she is not permanently relocating but is there to support the father of her baby's family as Brian's father has died.  A 90-day supply of Wellbutrin 300 mg XL every morning is sent with a 30-day supply of Adderall to 20 mg IR twice daily with no refills as [place] of [place] and she is following her father's advice to keep medicine supply.

## 2019-03-06 NOTE — Telephone Encounter (Signed)
Patient would like a 90 day refill on wellbutrin adderall, bc of coronavirus to be transferred to [facility] on [address] in [place] [phone]

## 2019-03-06 NOTE — Addendum Note (Signed)
Addended by: Beverly Milch E on: 03/06/2019 10:09 AM   Modules accepted: Orders

## 2019-04-01 ENCOUNTER — Telehealth: Payer: Self-pay | Admitting: Psychiatry

## 2019-04-01 DIAGNOSIS — F902 Attention-deficit hyperactivity disorder, combined type: Secondary | ICD-10-CM

## 2019-04-01 MED ORDER — AMPHETAMINE-DEXTROAMPHETAMINE 20 MG PO TABS: 20.0000 mg | ORAL_TABLET | Freq: Two times a day (BID) | ORAL | 0 refills | Status: DC

## 2019-04-01 MED ORDER — BUPROPION HCL ER (XL) 300 MG PO TB24: 300.0000 mg | ORAL_TABLET | Freq: Every day | ORAL | 1 refills | Status: DC

## 2019-04-01 NOTE — Telephone Encounter (Signed)
Anntoinette called to request refill on Brupropion and Adderall.  Next appt is 06/04/19.  See also requested a call to discuss a new medicine.

## 2019-04-01 NOTE — Addendum Note (Signed)
Addended by: Beverly Milch E on: 04/01/2019 12:59 PM   Modules accepted: Orders

## 2019-04-01 NOTE — Telephone Encounter (Signed)
[place] [name] calls requesting refill to [facility] on [address] for the bupropion not filled for more than 30 tablets by the [facility] store along with Adderall March 06, 2019.  She calls that she left fianc [place] [name] over his addiction who is attempting to pursue custody but will not provide a drug test to do so.  She is anxious over these affairs but she and the baby are safe.  She reports that she has transportation to get out today to the pharmacy relative to coronavirus stay at home and requests that her Adderall upcoming refill be sent 20 mg IR twice daily #60 with no refill to [facility] on [address] along with Wellbutrin 300 mg XL every morning #30 with 1 refill.

## 2019-04-22 ENCOUNTER — Telehealth: Payer: Self-pay | Admitting: Psychiatry

## 2019-04-22 DIAGNOSIS — F902 Attention-deficit hyperactivity disorder, combined type: Secondary | ICD-10-CM

## 2019-04-22 MED ORDER — AMPHETAMINE-DEXTROAMPHETAMINE 20 MG PO TABS: 20.0000 mg | ORAL_TABLET | Freq: Two times a day (BID) | ORAL | 0 refills | Status: DC

## 2019-04-22 NOTE — Telephone Encounter (Signed)
Patient's mom called and said that she washe dleighs pants with her bottle of adderrall in it now all the pills are mushy. Please call her and tell her what can be done [phone]

## 2019-04-22 NOTE — Telephone Encounter (Signed)
Last appointment 12/03/2018 with last fill 04/01/2019 at [facility] [address].  Mother phones today that she washed the pill bottle with pills in the washer destroying the tablets which melted and wishes to replace this for her daughter.  The 10 day supply remaining to next fill is sent to [facility] on [address] explaining likely pharmacy policies.

## 2019-04-29 ENCOUNTER — Telehealth: Payer: Self-pay | Admitting: Psychiatry

## 2019-04-29 DIAGNOSIS — F902 Attention-deficit hyperactivity disorder, combined type: Secondary | ICD-10-CM

## 2019-04-29 MED ORDER — AMPHETAMINE-DEXTROAMPHETAMINE 20 MG PO TABS: 20.0000 mg | ORAL_TABLET | Freq: Two times a day (BID) | ORAL | 0 refills | Status: DC

## 2019-04-29 NOTE — Telephone Encounter (Signed)
Prescribing cycle had been April 13 through May 13 for last Adderall, mother having washed 10-day supply in the washing machine in the interim.  May E scription for Adderall 20 mg IR #60 with no refill is sent to [facility] on [address] and [address]  For 05/01/2019 medically necessary with no contraindication.

## 2019-04-29 NOTE — Telephone Encounter (Signed)
Camie called to ask if she could pick up her prescription today while she is out running errands so she won't have to get back out again.  The prescription you submitted has a fill date of 05/01/19 so the pharmacy will not release it.  Would you call the pharmacy and approve her to pick up today.  [facility] [place] [phone]

## 2019-04-29 NOTE — Telephone Encounter (Signed)
Patient phones that she must be in the [place] tomorrow to have a child custody hearing with father of the baby [name] and must pick up her Adderall early to travel for that court hearing.  The prescription sent in for May is possible to pick up tomorrow according to pharmacist at [facility] on the insurance early pickup program otherwise the family will need to pay for the fill to pick up today.  I communicated with the patient all of these matters as well as talking extensively with the pharmacist attempting to clarify all the facets of the patient's dosing, clarifying the medical necessity for the medication and logistics of refilling as soon as possible for the court hearing in the best interest of her child and herself.

## 2019-04-29 NOTE — Telephone Encounter (Signed)
Patient left vm today @8 :49 stating she needs a May script filled  on generic Adderall to be sent to [facility] on [facility]

## 2019-05-29 ENCOUNTER — Telehealth: Payer: Self-pay | Admitting: Psychiatry

## 2019-05-29 DIAGNOSIS — F902 Attention-deficit hyperactivity disorder, combined type: Secondary | ICD-10-CM

## 2019-05-29 MED ORDER — AMPHETAMINE-DEXTROAMPHETAMINE 20 MG PO TABS: 20.0000 mg | ORAL_TABLET | Freq: Two times a day (BID) | ORAL | 0 refills | Status: DC

## 2019-05-29 NOTE — Telephone Encounter (Signed)
Patient phones that she will be in [place] again this weekend apparently relative to father of the baby's status.  [facility] [place] [facility] by E scription #60 of the 20 mg IR twice daily no refill medically necessary no contraindication for fill 05/30/2019

## 2019-05-29 NOTE — Telephone Encounter (Signed)
Patient would like medication refill for Adderall to be sent to [facility] [place] [phone] patient would like script filled before the weekend

## 2019-06-04 ENCOUNTER — Ambulatory Visit: Payer: BLUE CROSS/BLUE SHIELD | Admitting: Psychiatry

## 2019-06-13 ENCOUNTER — Other Ambulatory Visit: Payer: Self-pay

## 2019-06-13 ENCOUNTER — Ambulatory Visit: Payer: BLUE CROSS/BLUE SHIELD | Admitting: Psychiatry

## 2019-06-13 ENCOUNTER — Encounter: Payer: Self-pay | Admitting: Psychiatry

## 2019-06-13 VITALS — Ht 62.0 in | Wt 133.0 lb

## 2019-06-13 DIAGNOSIS — F1021 Alcohol dependence, in remission: Secondary | ICD-10-CM | POA: Diagnosis not present

## 2019-06-13 DIAGNOSIS — F902 Attention-deficit hyperactivity disorder, combined type: Secondary | ICD-10-CM | POA: Diagnosis not present

## 2019-06-13 DIAGNOSIS — F411 Generalized anxiety disorder: Secondary | ICD-10-CM

## 2019-06-13 DIAGNOSIS — F3281 Premenstrual dysphoric disorder: Secondary | ICD-10-CM | POA: Diagnosis not present

## 2019-06-13 MED ORDER — BUPROPION HCL ER (XL) 300 MG PO TB24: 300.0000 mg | ORAL_TABLET | Freq: Every day | ORAL | 1 refills | Status: AC

## 2019-06-13 NOTE — Progress Notes (Signed)
[facility]  Patient ID: Taylor [name],  MRN: [id number]  PCP: Patient, No Pcp Per  Date of Evaluation: 06/13/2019 Time spent:20 minutes from 1320 to 1340  Chief Complaint:  Chief Complaint    ADHD; Depression; Anxiety; Addiction Problem      HISTORY/CURRENT STATUS: Taylor [name] is seen onsite in office face-to-face individually with consent with epic collateral for psychiatric interview and exam in 5337-month evaluation and management of ADHD, PMDD, generalized anxiety disorder, and alcohol use disorder in partial remission.  The patient has 9 phone contacts with this office requiring modifications in her Adderall in the interim.  However apparently the proceedings on separating from father of her baby have finally been completed in [place] so that he has only supervised visitation with his mother every other weekend, though patient states he has already defied this.  Taylor [name] has studied including with mother and aunts the patterns in their family of women taking over as parent of placement and primary custody of the children as the fathers of the children have been irresponsible and poor role models.  Patient can now clarify the serious addiction of the father of the baby and the need to prevent such for herself whether alcohol or amphetamine.  She goes to NA regularly and is considering obtaining her [place] while maintaining participation in her project in [place] without taking [place] baby there.  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  She emphasizes she understands the need to take her Wellbutrin every day, and she understands that she needs adaptations and skills without depending on Adderall for every activity.  She has no mania, suicidality, psychosis, or delirium.  Depression       The patient presents  with depression as a chronic problem.  The current episode started more than 1 year ago with onset quality gradual.   The problem occurs intermittently.  The problem has been waxing and waning since onset.  Associated symptoms include decreased concentration, hopelessness, restlessness, decreased interest, myalgias and sad.  Associated symptoms include no fatigue, no helplessness, does not have insomnia, not irritable, no appetite change, no headaches and no suicidal ideas.     The symptoms are aggravated by work stress, family issues and social issues.  Past treatments include other medications and psychotherapy.  Compliance with treatment is variable and good.  Past compliance problems include difficulty with treatment plan and medication issues.  Risk factors include family history of mental illness, family history, history of mental illness, history of suicide attempt, illicit drug use, marital problems, major life event and stress.   Past medical history includes anxiety, depression and mental health disorder.     Pertinent negatives include no thyroid problem, no chronic illness, no recent illness, no bipolar disorder, no eating disorder, no obsessive-compulsive disorder, no post-traumatic stress disorder, no schizophrenia and no suicide attempts.  Individual Medical History/ Review of Systems: Changes? :Yes  She continues Yaz birth control pill for PMDD having been in the ED for right lower quadrant abdominal pain 02/05/2019 after diarrhea 8 times in  1 day likely mesenteric adenitis  Allergies: Bee venom  Current Medications:  Current Outpatient Medications:  .  acetaminophen (TYLENOL) 500 MG tablet, Take 1,000 mg by mouth every 6 (six) hours as needed for mild pain or headache., Disp: , Rfl:  .  aluminum-magnesium  hydroxide-simethicone (MAALOX) 200-200-20 MG/5ML SUSP, Take 30 mLs by mouth 3 (three) times daily as needed (For acid reflux.)., Disp: , Rfl:  .  amphetamine-dextroamphetamine (ADDERALL)  20 MG tablet, Take 1 tablet (20 mg total) by mouth 2 (two) times daily for 10 days., Disp: 20 tablet, Rfl: 0 .  amphetamine-dextroamphetamine (ADDERALL) 20 MG tablet, Take 1 tablet (20 mg total) by mouth 2 (two) times daily for 30 days., Disp: 60 tablet, Rfl: 0 .  amphetamine-dextroamphetamine (ADDERALL) 20 MG tablet, Take 1 tablet (20 mg total) by mouth 2 (two) times daily for 30 days., Disp: 60 tablet, Rfl: 0 .  benzocaine-Menthol (DERMOPLAST) 20-0.5 % AERO, Apply 1 application topically as needed for irritation (perineal discomfort)., Disp: , Rfl:  .  buPROPion (WELLBUTRIN XL) 300 MG 24 hr tablet, Take 1 tablet (300 mg total) by mouth daily after breakfast., Disp: 90 tablet, Rfl: 1 .  coconut oil OIL, Apply 1 application topically as needed., Disp: , Rfl: 0 .  drospirenone-ethinyl estradiol (YAZ) 3-0.02 MG tablet, Take 1 tablet by mouth daily at 2 PM., Disp: , Rfl:  .  ibuprofen (ADVIL,MOTRIN) 800 MG tablet, Take 1 tablet (800 mg total) by mouth every 6 (six) hours., Disp: 30 tablet, Rfl: 0 .  ondansetron (ZOFRAN) 4 MG tablet, Take 1 tablet (4 mg total) by mouth every 6 (six) hours., Disp: 12 tablet, Rfl: 0 .  Prenatal Vit-Fe Fumarate-FA (PRENATAL MULTIVITAMIN) TABS tablet, Take 1 tablet by mouth daily at 12 noon., Disp: , Rfl:    Medication Side Effects: none  Family Medical/ Social History: Changes? Yes major changes in her relationship and parenting life most associated with husband's opiate addiction.  MENTAL HEALTH EXAM:  Height 5\' 2"  (1.575 m), weight 133 lb (60.3 kg), unknown if currently breastfeeding.Body mass index is 24.33 kg/m.  Others deferred as nonessential as coronavirus pandemic  General Appearance: Casual, Fairly Groomed and Guarded  Eye Contact:  Fair  Speech:  Clear and Coherent, Normal Rate and Talkative  Volume:  Normal  Mood:  Anxious, Dysphoric, Euthymic, Irritable and Worthless  Affect:  Depressed, Inappropriate, Labile and Anxious  Thought Process:  Goal  Directed, Irrelevant and Linear  Orientation:  Full (Time, Place, and Person)  Thought Content: Ilusions, Obsessions and Rumination   Suicidal Thoughts:  No  Homicidal Thoughts:  No  Memory:  Immediate;   Good Remote;   Good  Judgement:  Fair  Insight:  Fair  Psychomotor Activity:  Normal, Increased, Mannerisms and Restlessness  Concentration:  Concentration: Fair and Attention Span: Poor  Recall:  Good  Fund of Knowledge: Good  Language: Good  Assets:  Desire for Improvement Resilience Vocational/Educational  ADL's:  Intact  Cognition: WNL  Prognosis:  Fair    DIAGNOSES:    ICD-10-CM   1. Attention deficit hyperactivity disorder (ADHD), combined type, severe  F90.2   2. PMDD (premenstrual dysphoric disorder)  F32.81   3. Generalized anxiety disorder  F41.1   4. Alcohol use disorder, severe, in early remission, dependence (HCC)  F10.21   5. Attention deficit hyperactivity disorder (ADHD), combined type  F90.2 buPROPion (WELLBUTRIN XL) 300 MG 24 hr tablet    Receiving Psychotherapy: No except she does attend NA   RECOMMENDATIONS: Psychosupportive psychoeducation is provided patient on all issues other than offering no room for debate on the Adderall.  She continues the monthly supply pickup at the pharmacy by E scription as legally and relationally best for her.  Adderall 20 mg IR twice daily #60 was sent to  [facility]30[facility]2019 for ADHD as she is more consistently using the [facility] unless in [place] where she uses [facility] there when due for court with father of the baby.  She continues Wellbutrin 300 mg XL every morning sent as #90 with 1 refill to [facility] on [address] for ADHD, PMDD, GAD, and alcohol use disorder. 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  She agrees to follow-up in 6 months.  Delight Hoh, MD

## 2019-06-24 ENCOUNTER — Telehealth: Payer: Self-pay | Admitting: Psychiatry

## 2019-06-24 DIAGNOSIS — F902 Attention-deficit hyperactivity disorder, combined type: Secondary | ICD-10-CM

## 2019-06-24 MED ORDER — AMPHETAMINE-DEXTROAMPHETAMINE 20 MG PO TABS: 20.0000 mg | ORAL_TABLET | Freq: Two times a day (BID) | ORAL | 0 refills | Status: DC

## 2019-06-24 NOTE — Telephone Encounter (Signed)
Patient called and said that she is going out of town and needs a refill on her adderall 20 mg to be [facility] [place] She would like to pick up the medicine tomorrow

## 2019-06-24 NOTE — Telephone Encounter (Signed)
At last appointment 06/13/2019, patient suggested she and family have established a preventative mutual support for proper use of medications considering the father of her baby's addiction.  She must travel to [facility] for the baby to see father under the supervision of paternal grandmother to be there tomorrow.  She is uncertain of the itinerary from there, but we send her Adderall 20 mg IR twice daily due for 06/29/2019 2 [facility] [place] tomorrow which is the pharmacy where she filled the last E scription 05/30/2019.

## 2019-06-25 ENCOUNTER — Telehealth: Payer: Self-pay | Admitting: Psychiatry

## 2019-06-25 ENCOUNTER — Other Ambulatory Visit: Payer: Self-pay

## 2019-06-25 DIAGNOSIS — F902 Attention-deficit hyperactivity disorder, combined type: Secondary | ICD-10-CM

## 2019-06-25 MED ORDER — AMPHETAMINE-DEXTROAMPHETAMINE 20 MG PO TABS: 20.0000 mg | ORAL_TABLET | Freq: Two times a day (BID) | ORAL | 0 refills | Status: DC

## 2019-06-25 NOTE — Telephone Encounter (Signed)
Pt Adderall 20mg  was called in to the wrong pharmacy. She needs it sent to [facility] in [place] center. She travels from here to [place], [place].

## 2019-06-25 NOTE — Telephone Encounter (Signed)
A copy of the in basket message by patient to reception yesterday 06/24/2019 is placed on the office paper chart to attest to Adderall 20 mg IR twice daily #60 being sent to [facility] in [place] yesterday when today patient states that it should have been sent to [place] [facility] on [address] sent to them today by E scription communicating  to all the change.

## 2019-07-30 ENCOUNTER — Telehealth: Payer: Self-pay | Admitting: Psychiatry

## 2019-07-30 NOTE — Telephone Encounter (Signed)
I appreciate alerting from [facility] registry that patient has switched her Adderall from 20 to 30 mg and can no longer be prescribed here as she now has it prescribed by Dr. Kathryne [name] who is mostly at [facility].  She is also restarted Xanax 0.25 mg from Dr. Zola [name] having to E scription's for 60 tablets of Adderall in July most recent at the end of July so that I called the number of mother reaching answering machine of Taylor [name] acknowledging that she has switched her medication management over to Dr. Redmond [name] and that therefore I will not prescribe the Adderall mother requests in [place].

## 2019-07-30 NOTE — Telephone Encounter (Signed)
Mom Wells Guiles

## 2019-07-30 NOTE — Telephone Encounter (Signed)
Pt mom called for Keelie to get her Adderall refilled while she moves. Fill at the [facility] in [place]

## 2020-02-24 ENCOUNTER — Ambulatory Visit: Payer: BC Managed Care – PPO | Attending: Internal Medicine

## 2020-02-24 DIAGNOSIS — Z23 Encounter for immunization: Secondary | ICD-10-CM | POA: Insufficient documentation

## 2020-02-24 NOTE — Progress Notes (Signed)
   Covid-19 Vaccination Clinic  Name:  NIKIESHA MILFORD    MRN: [id number] DOB: [birth year]  02/24/2020  Ms. Bostic was observed post Covid-19 immunization for 15 minutes without incident. She was provided with Vaccine Information Sheet and instruction to access the V-Safe system.   Ms. Stiggers was instructed to call 911 with any severe reactions post vaccine: Marland Kitchen Difficulty breathing  . Swelling of face and throat  . A fast heartbeat  . A bad rash all over body  . Dizziness and weakness   Immunizations Administered    Name Date Dose VIS Date Route   Pfizer COVID-19 Vaccine 02/24/2020 11:10 AM 0.3 mL 11/29/2019 Intramuscular   Manufacturer: [employer], [employer]   Lot: [id number]   NDC: [address]-3

## 2020-03-25 ENCOUNTER — Ambulatory Visit: Payer: BC Managed Care – PPO | Attending: Internal Medicine

## 2020-03-25 DIAGNOSIS — Z23 Encounter for immunization: Secondary | ICD-10-CM

## 2020-03-25 NOTE — Progress Notes (Signed)
   Covid-19 Vaccination Clinic  Name:  Taylor [name]    MRN: [id number] DOB: 09/07/1981  03/25/2020  Taylor [name] was observed post Covid-19 immunization for 15 minutes without incident. She was provided with Vaccine Information Sheet and instruction to access the V-Safe system.   Taylor [name] was instructed to call 911 with any severe reactions post vaccine: Marland Kitchen Difficulty breathing  . Swelling of face and throat  . A fast heartbeat  . A bad rash all over body  . Dizziness and weakness   Immunizations Administered    Name Date Dose VIS Date Route   Pfizer COVID-19 Vaccine 03/25/2020 11:40 AM 0.3 mL 11/29/2019 Intramuscular   Manufacturer: [employer], [employer]   Lot: [id number]   NDC: [address]-2

## 2020-10-08 ENCOUNTER — Encounter: Payer: Self-pay | Admitting: Psychiatry

## 2020-11-09 ENCOUNTER — Ambulatory Visit: Payer: BLUE CROSS/BLUE SHIELD | Admitting: Psychiatry

## 2020-11-26 ENCOUNTER — Other Ambulatory Visit: Payer: Self-pay

## 2020-11-26 ENCOUNTER — Emergency Department (HOSPITAL_COMMUNITY)
Admission: EM | Admit: 2020-11-26 | Discharge: 2020-11-26 | Disposition: A | Discharge status: Home / Self Care | Payer: BC Managed Care – PPO | Attending: Emergency Medicine | Admitting: Emergency Medicine

## 2020-11-26 ENCOUNTER — Emergency Department (HOSPITAL_COMMUNITY): Payer: BC Managed Care – PPO

## 2020-11-26 DIAGNOSIS — I10 Essential (primary) hypertension: Secondary | ICD-10-CM | POA: Diagnosis not present

## 2020-11-26 DIAGNOSIS — F17210 Nicotine dependence, cigarettes, uncomplicated: Secondary | ICD-10-CM | POA: Insufficient documentation

## 2020-11-26 DIAGNOSIS — S80212A Abrasion, left knee, initial encounter: Secondary | ICD-10-CM | POA: Insufficient documentation

## 2020-11-26 DIAGNOSIS — J45909 Unspecified asthma, uncomplicated: Secondary | ICD-10-CM | POA: Insufficient documentation

## 2020-11-26 DIAGNOSIS — F10920 Alcohol use, unspecified with intoxication, uncomplicated: Secondary | ICD-10-CM

## 2020-11-26 DIAGNOSIS — F1910 Other psychoactive substance abuse, uncomplicated: Secondary | ICD-10-CM

## 2020-11-26 DIAGNOSIS — R4182 Altered mental status, unspecified: Secondary | ICD-10-CM | POA: Insufficient documentation

## 2020-11-26 LAB — COMPREHENSIVE METABOLIC PANEL
ALT: 18 U/L (ref 0–44)
AST: 20 U/L (ref 15–41)
Albumin: 3.8 g/dL (ref 3.5–5.0)
Alkaline Phosphatase: 48 U/L (ref 38–126)
Anion gap: 13 (ref 5–15)
BUN: 10 mg/dL (ref 6–20)
CO2: 17 mmol/L — ABNORMAL LOW (ref 22–32)
Calcium: 8.9 mg/dL (ref 8.9–10.3)
Chloride: 116 mmol/L — ABNORMAL HIGH (ref 98–111)
Creatinine, Ser: 0.62 mg/dL (ref 0.44–1.00)
GFR, Estimated: >60 mL/min (ref >60)
Glucose, Bld: 94 mg/dL (ref 70–99)
Potassium: 4 mmol/L (ref 3.5–5.1)
Sodium: 146 mmol/L — ABNORMAL HIGH (ref 135–145)
Total Bilirubin: 0.4 mg/dL (ref 0.3–1.2)
Total Protein: 6.9 g/dL (ref 6.5–8.1)

## 2020-11-26 LAB — CBC WITH DIFFERENTIAL/PLATELET
Abs Immature Granulocytes: 0.05 10*3/uL (ref 0.00–0.07)
Basophils Absolute: 0.1 10*3/uL (ref 0.0–0.1)
Basophils Relative: 1 %
Eosinophils Absolute: 0.3 10*3/uL (ref 0.0–0.5)
Eosinophils Relative: 4 %
HCT: 45 % (ref 36.0–46.0)
Hemoglobin: 14.2 g/dL (ref 12.0–15.0)
Immature Granulocytes: 1 %
Lymphocytes Relative: 30 %
Lymphs Abs: 2.3 10*3/uL (ref 0.7–4.0)
MCH: 30.9 pg (ref 26.0–34.0)
MCHC: 31.6 g/dL (ref 30.0–36.0)
MCV: 97.8 fL (ref 80.0–100.0)
Monocytes Absolute: 0.5 10*3/uL (ref 0.1–1.0)
Monocytes Relative: 6 %
Neutro Abs: 4.4 10*3/uL (ref 1.7–7.7)
Neutrophils Relative %: 58 %
Platelets: 231 10*3/uL (ref 150–400)
RBC: 4.6 MIL/uL (ref 3.87–5.11)
RDW: 14.7 % (ref 11.5–15.5)
WBC: 7.6 10*3/uL (ref 4.0–10.5)
nRBC: 0 % (ref 0.0–0.2)

## 2020-11-26 LAB — RAPID URINE DRUG SCREEN, HOSP PERFORMED
Amphetamines: NOT DETECTED
Barbiturates: NOT DETECTED
Benzodiazepines: NOT DETECTED
Cocaine: NOT DETECTED
Opiates: NOT DETECTED
Tetrahydrocannabinol: NOT DETECTED

## 2020-11-26 LAB — URINALYSIS, ROUTINE W REFLEX MICROSCOPIC
Bilirubin Urine: NEGATIVE
Glucose, UA: NEGATIVE mg/dL
Hgb urine dipstick: NEGATIVE
Ketones, ur: NEGATIVE mg/dL
Leukocytes,Ua: NEGATIVE
Nitrite: NEGATIVE
Protein, ur: NEGATIVE mg/dL
Specific Gravity, Urine: 1.003 — ABNORMAL LOW (ref 1.005–1.030)
pH: 6 (ref 5.0–8.0)

## 2020-11-26 LAB — I-STAT BETA HCG BLOOD, ED (MC, WL, AP ONLY): I-stat hCG, quantitative: <5 m[IU]/mL (ref <5)

## 2020-11-26 LAB — LIPASE, BLOOD: Lipase: 30 U/L (ref 11–51)

## 2020-11-26 LAB — ETHANOL: Alcohol, Ethyl (B): 381 mg/dL (ref <10)

## 2020-11-26 MED ORDER — SODIUM CHLORIDE 0.9 % IV BOLUS
500.0000 mL | Freq: Once | INTRAVENOUS | Status: AC
  Administered 2020-11-26: 500 mL via INTRAVENOUS

## 2020-11-26 MED ORDER — SODIUM CHLORIDE 0.9 % IV SOLN: INTRAVENOUS | Status: DC
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
## 2020-11-26 NOTE — ED Notes (Signed)
Pt now alert and oriented x 4. States she feels fine and that it is her son's birthday tomorrow.
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
## 2021-02-26 ENCOUNTER — Other Ambulatory Visit: Payer: Self-pay

## 2021-02-26 ENCOUNTER — Encounter (HOSPITAL_COMMUNITY): Payer: Self-pay

## 2021-02-26 ENCOUNTER — Emergency Department (HOSPITAL_COMMUNITY): Payer: BC Managed Care – PPO

## 2021-02-26 ENCOUNTER — Emergency Department (HOSPITAL_COMMUNITY)
Admission: EM | Admit: 2021-02-26 | Discharge: 2021-02-27 | Disposition: A | Discharge status: Home / Self Care | Payer: BC Managed Care – PPO | Attending: Emergency Medicine | Admitting: Emergency Medicine

## 2021-02-26 DIAGNOSIS — R4182 Altered mental status, unspecified: Secondary | ICD-10-CM | POA: Diagnosis present

## 2021-02-26 DIAGNOSIS — F10129 Alcohol abuse with intoxication, unspecified: Secondary | ICD-10-CM | POA: Insufficient documentation

## 2021-02-26 DIAGNOSIS — F17210 Nicotine dependence, cigarettes, uncomplicated: Secondary | ICD-10-CM | POA: Insufficient documentation

## 2021-02-26 LAB — CBC WITH DIFFERENTIAL/PLATELET
Abs Immature Granulocytes: 0.02 10*3/uL (ref 0.00–0.07)
Basophils Absolute: 0.1 10*3/uL (ref 0.0–0.1)
Basophils Relative: 1 %
Eosinophils Absolute: 0.3 10*3/uL (ref 0.0–0.5)
Eosinophils Relative: 5 %
HCT: 42 % (ref 36.0–46.0)
Hemoglobin: 14 g/dL (ref 12.0–15.0)
Immature Granulocytes: 0 %
Lymphocytes Relative: 38 %
Lymphs Abs: 2.4 10*3/uL (ref 0.7–4.0)
MCH: 30.5 pg (ref 26.0–34.0)
MCHC: 33.3 g/dL (ref 30.0–36.0)
MCV: 91.5 fL (ref 80.0–100.0)
Monocytes Absolute: 0.5 10*3/uL (ref 0.1–1.0)
Monocytes Relative: 7 %
Neutro Abs: 3.1 10*3/uL (ref 1.7–7.7)
Neutrophils Relative %: 49 %
Platelets: 303 10*3/uL (ref 150–400)
RBC: 4.59 MIL/uL (ref 3.87–5.11)
RDW: 14 % (ref 11.5–15.5)
WBC: 6.3 10*3/uL (ref 4.0–10.5)
nRBC: 0 % (ref 0.0–0.2)

## 2021-02-26 LAB — I-STAT BETA HCG BLOOD, ED (MC, WL, AP ONLY): I-stat hCG, quantitative: <5 m[IU]/mL (ref <5)

## 2021-02-26 LAB — URINALYSIS, ROUTINE W REFLEX MICROSCOPIC
Bilirubin Urine: NEGATIVE
Glucose, UA: NEGATIVE mg/dL
Hgb urine dipstick: NEGATIVE
Ketones, ur: NEGATIVE mg/dL
Leukocytes,Ua: NEGATIVE
Nitrite: NEGATIVE
Protein, ur: NEGATIVE mg/dL
Specific Gravity, Urine: 1.003 — ABNORMAL LOW (ref 1.005–1.030)
pH: 7 (ref 5.0–8.0)

## 2021-02-26 LAB — COMPREHENSIVE METABOLIC PANEL
ALT: 30 U/L (ref 0–44)
AST: 28 U/L (ref 15–41)
Albumin: 3.8 g/dL (ref 3.5–5.0)
Alkaline Phosphatase: 52 U/L (ref 38–126)
Anion gap: 9 (ref 5–15)
BUN: 9 mg/dL (ref 6–20)
CO2: 24 mmol/L (ref 22–32)
Calcium: 8.4 mg/dL — ABNORMAL LOW (ref 8.9–10.3)
Chloride: 111 mmol/L (ref 98–111)
Creatinine, Ser: 0.61 mg/dL (ref 0.44–1.00)
GFR, Estimated: >60 mL/min (ref >60)
Glucose, Bld: 95 mg/dL (ref 70–99)
Potassium: 3.7 mmol/L (ref 3.5–5.1)
Sodium: 144 mmol/L (ref 135–145)
Total Bilirubin: 0.7 mg/dL (ref 0.3–1.2)
Total Protein: 7.1 g/dL (ref 6.5–8.1)

## 2021-02-26 LAB — RAPID URINE DRUG SCREEN, HOSP PERFORMED
Amphetamines: NOT DETECTED
Barbiturates: NOT DETECTED
Benzodiazepines: POSITIVE — AB
Cocaine: NOT DETECTED
Opiates: NOT DETECTED
Tetrahydrocannabinol: NOT DETECTED

## 2021-02-26 LAB — ACETAMINOPHEN LEVEL: Acetaminophen (Tylenol), Serum: <10 ug/mL — ABNORMAL LOW (ref 10–30)

## 2021-02-26 LAB — LACTIC ACID, PLASMA: Lactic Acid, Venous: 2.1 mmol/L (ref 0.5–1.9)

## 2021-02-26 LAB — AMMONIA: Ammonia: 65 umol/L — ABNORMAL HIGH (ref 9–35)

## 2021-02-26 LAB — SALICYLATE LEVEL: Salicylate Lvl: <7 mg/dL — ABNORMAL LOW (ref 7.0–30.0)

## 2021-02-26 LAB — ETHANOL: Alcohol, Ethyl (B): 370 mg/dL (ref <10)

## 2021-02-26 LAB — CBG MONITORING, ED: Glucose-Capillary: 96 mg/dL (ref 70–99)

## 2021-02-26 MED ORDER — CHLORDIAZEPOXIDE HCL 25 MG PO CAPS
25.0000 mg | ORAL_CAPSULE | Freq: Once | ORAL | Status: AC
  Administered 2021-02-26: 25 mg via ORAL
  Filled 2021-02-26: qty 1

## 2021-02-26 MED ORDER — SODIUM CHLORIDE 0.9 % IV BOLUS
1000.0000 mL | Freq: Once | INTRAVENOUS | Status: AC
  Administered 2021-02-26: 1000 mL via INTRAVENOUS

## 2021-02-26 MED ORDER — CHLORDIAZEPOXIDE HCL 25 MG PO CAPS: ORAL_CAPSULE | ORAL | 0 refills | Status: DC

## 2021-02-26 MED ORDER — THIAMINE HCL 100 MG/ML IJ SOLN
100.0000 mg | Freq: Once | INTRAMUSCULAR | Status: AC
  Administered 2021-02-26: 100 mg via INTRAVENOUS
  Filled 2021-02-26: qty 2
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
## 2021-02-26 NOTE — Discharge Instructions (Addendum)
DO NOT take ativan and DO NOT drink alcohol while taking the librium taper. Peer support should reach out to you tomorrow. Get help right away if: You have any of the following: Moderate to severe trouble with coordination, speech, memory, or attention. Trouble staying awake. Severe confusion. A seizure. Light-headedness. Fainting. Vomiting bright red blood or material that looks like coffee grounds. Bloody stool (feces). The blood may make your stool bright red, black, or tarry. It may also smell bad. Shakiness when trying to stop drinking. Thoughts about hurting yourself or others.

## 2021-02-26 NOTE — ED Provider Notes (Signed)
[facility]-EMERGENCY DEPT Provider Note   CSN: [id number] Arrival date & time: 02/26/21  1546     History Chief Complaint  Patient presents with  . Altered Mental Status    Taylor [name] is a 40 y.o. female who presents emergency department for altered mental status.  History is taken from paramedic at bedside as well as a phone call with her father Dr. Mosetta Putt.  And patient reportedly has a history of polysubstance abuse specifically alcohol abuse with multiple previous rehabilitation attempts.  The patient has also recently been using inhalants, mostly keyboard cleaner because she has not installed breathalyzer on her car and this helps her get around the breathalyzer.  Apparently she called her father earlier that day and had very slurred speech.  He sent her brother over who noted that she was unresponsive and blue.  He moved her to the floor per EMS command and she became more responsive.  Dr. Duaine Dredge feels that this is most likely alcohol intoxication as he went over to the house and only saw multiple empty alcohol containers.  The patient is otherwise unable to provide history at this time.  There is a level 5 caveat.  HPI     Past Medical History:  Diagnosis Date  . Anemia   . Anxiety   . Depression    welbutrin - stopped taking it early pregn but will be back on it after    Patient Active Problem List   Diagnosis Date Noted  . Delayed delivery after SROM (spontaneous rupture of membranes) 11/27/2017  . SVD (spontaneous vaginal delivery) 11/27/2017  . Postpartum care following vaginal delivery (12/10) 11/27/2017  . Second-degree perineal laceration, with delivery 11/27/2017  . PROM (premature rupture of membranes) 11/26/2017  . Generalized anxiety disorder 01/30/2017  . Adderall use disorder, mild, abuse (HCC) 06/27/2016  . Convulsions/seizures--[facility] 07/2015--etoh!! 08/01/2015  . Alcohol use disorder, severe, in early remission,  dependence (HCC) 08/01/2015  . PMDD (premenstrual dysphoric disorder) 10/16/2013  . Attention deficit hyperactivity disorder (ADHD), combined type, severe 05/08/2013  . Depression, reactive 05/08/2013    Past Surgical History:  Procedure Laterality Date  . KNEE SURGERY     right knee surgery 4 times - soccer     OB History    Gravida  1   Para  1   Term  1   Preterm      AB      Living  1     SAB      IAB      Ectopic      Multiple  0   Live Births  1           Family History  Problem Relation Age of Onset  . Alcohol abuse Mother   . Drug abuse Mother   . Hypertension Mother   . Heart disease Mother   . Miscarriages / [place] Mother   . Arthritis Father   . Asthma Father   . Depression Father   . Hypertension Father   . Varicose Veins Father   . Asthma Sister   . Learning disabilities Sister   . Alcohol abuse Brother   . Depression Brother   . Drug abuse Brother   . Hypertension Brother   . Learning disabilities Brother   . Mental illness Brother   . Alcohol abuse Maternal Aunt   . Depression Maternal Aunt   . Drug abuse Maternal Aunt   . Mental illness Maternal Aunt   .  Alcohol abuse Maternal Uncle   . Depression Paternal Aunt   . Depression Paternal Uncle   . Depression Maternal Grandmother   . Heart disease Maternal Grandmother   . Alcohol abuse Maternal Grandfather   . Depression Maternal Grandfather   . Depression Paternal Grandmother   . Heart disease Paternal Grandmother   . Depression Paternal Grandfather     Social History   Tobacco Use  . Smoking status: Current Some Day Smoker    Packs/day: 1.50    Years: 4.00    Pack years: 6.00    Types: Cigarettes  . Smokeless tobacco: Never Used  . Tobacco comment: cutting back   Vaping Use  . Vaping Use: Some days  Substance Use Topics  . Alcohol use: Yes    Alcohol/week: 6.0 standard drinks    Types: 6 Standard drinks or equivalent per week    Comment: past 5 years vodka  and beer  . Drug use: No    Home Medications Prior to Admission medications   Medication Sig Start Date End Date Taking? Authorizing Provider  ALPRAZolam Prudy Feeler) 0.25 MG tablet Take 0.5 mg by mouth at bedtime. 02/10/21  Yes [provider]  amphetamine-dextroamphetamine (ADDERALL) 30 MG tablet Take 20 mg by mouth in the morning and at bedtime. Take 2/3 tablet (20 mg) BID 01/29/21  Yes [provider]  buPROPion (WELLBUTRIN XL) 300 MG 24 hr tablet Take 1 tablet (300 mg total) by mouth daily after breakfast. 06/13/19  Yes Chauncey Mann, MD  chlordiazePOXIDE (LIBRIUM) 25 MG capsule 50mg  PO TID x 2 D, then 50mg  PO BID X 2 D, then 25 BID x 2 D, then 25 mg daily x 2 D 02/26/21  Yes Harris, Abigail, PA-C  disulfiram (ANTABUSE) 250 MG tablet Take 250 mg by mouth every evening. 12/19/20  Yes [provider]  drospirenone-ethinyl estradiol (YAZ) 3-0.02 MG tablet Take 1 tablet by mouth every evening. 05/17/19  Yes [provider]  acetaminophen (TYLENOL) 500 MG tablet Take 1,000 mg by mouth every 6 (six) hours as needed for mild pain or headache.    [provider]  amphetamine-dextroamphetamine (ADDERALL) 20 MG tablet Take 1 tablet (20 mg total) by mouth 2 (two) times daily for 30 days. 05/01/19 05/31/19  05/03/19, MD  amphetamine-dextroamphetamine (ADDERALL) 20 MG tablet Take 1 tablet (20 mg total) by mouth 2 (two) times daily for 30 days. 05/30/19 06/29/19  07/30/19, MD  amphetamine-dextroamphetamine (ADDERALL) 20 MG tablet Take 1 tablet (20 mg total) by mouth 2 (two) times daily. Patient not taking: No sig reported 06/25/19   Chauncey Mann, MD  benzocaine-Menthol (DERMOPLAST) 20-0.5 % AERO Apply 1 application topically as needed for irritation (perineal discomfort). Patient not taking: No sig reported 11/29/17   Chauncey Mann, CNM  coconut oil OIL Apply 1 application topically as needed. Patient not taking: No sig reported 11/29/17   Neta Mends, CNM  ibuprofen (ADVIL,MOTRIN) 800 MG tablet Take 1 tablet (800 mg total) by mouth every 6 (six) hours. Patient not taking: No sig reported 11/29/17   Neta Mends, CNM  ondansetron (ZOFRAN) 4 MG tablet Take 1 tablet (4 mg total) by mouth every 6 (six) hours. Patient not taking: No sig reported 02/05/19   Neta Mends S, PA-C    Allergies    Bee venom  Review of Systems   Review of Systems  Unable to perform ROS: Mental status change    Physical Exam Updated Vital  Signs BP 112/71   Pulse 98   Temp 98.3 F (36.8 C) (Oral)   Resp 19   SpO2 99%   Physical Exam Vitals and nursing note reviewed.  Constitutional:      General: She is not in acute distress.    Appearance: She is well-developed. She is not diaphoretic.  HENT:     Head: Normocephalic and atraumatic.  Eyes:     General: No scleral icterus.    Conjunctiva/sclera: Conjunctivae normal.     Comments: Pupils mid, sluggish but ERRL  Cardiovascular:     Rate and Rhythm: Normal rate and regular rhythm.     Heart sounds: Normal heart sounds. No murmur heard. No friction rub. No gallop.   Pulmonary:     Effort: Pulmonary effort is normal. No respiratory distress.     Breath sounds: Normal breath sounds.  Abdominal:     General: Bowel sounds are normal. There is no distension.     Palpations: Abdomen is soft. There is no mass.     Tenderness: There is no abdominal tenderness. There is no guarding.  Musculoskeletal:     Cervical back: Normal range of motion.  Skin:    General: Skin is warm and dry.  Neurological:     Mental Status: She is alert and oriented to person, place, and time.     GCS: GCS eye subscore is 1. GCS verbal subscore is 2. GCS motor subscore is 4.  Psychiatric:        Behavior: Behavior normal.     ED Results / Procedures / Treatments   Labs (all labs ordered are listed, but only abnormal results are displayed) Labs Reviewed  COMPREHENSIVE METABOLIC PANEL - Abnormal; Notable  for the following components:      Result Value   Calcium 8.4 (*)    All other components within normal limits  URINALYSIS, ROUTINE W REFLEX MICROSCOPIC - Abnormal; Notable for the following components:   Color, Urine COLORLESS (*)    Specific Gravity, Urine 1.003 (*)    All other components within normal limits  AMMONIA - Abnormal; Notable for the following components:   Ammonia 65 (*)    All other components within normal limits  LACTIC ACID, PLASMA - Abnormal; Notable for the following components:   Lactic Acid, Venous 2.1 (*)    All other components within normal limits  RAPID URINE DRUG SCREEN, HOSP PERFORMED - Abnormal; Notable for the following components:   Benzodiazepines POSITIVE (*)    All other components within normal limits  ETHANOL - Abnormal; Notable for the following components:   Alcohol, Ethyl (B) 370 (*)    All other components within normal limits  SALICYLATE LEVEL - Abnormal; Notable for the following components:   Salicylate Lvl <7.0 (*)    All other components within normal limits  ACETAMINOPHEN LEVEL - Abnormal; Notable for the following components:   Acetaminophen (Tylenol), Serum <10 (*)    All other components within normal limits  CBC WITH DIFFERENTIAL/PLATELET  CBG MONITORING, ED  I-STAT BETA HCG BLOOD, ED ([facility], [facility], [facility] ONLY)    EKG EKG Interpretation  Date/Time:  Friday February 26 2021 17:27:27 EST Ventricular Rate:  89 PR Interval:    QRS Duration: 85 QT Interval:  359 QTC Calculation: 437 R Axis:   60 Text Interpretation: Sinus rhythm Anteroseptal infarct, old since last tracing no significant change Confirmed by Rolan Bucco [phone]) on 02/26/2021 8:54:51 PM   Radiology DG Chest 1 View  Result Date:  02/26/2021 CLINICAL DATA:  Unresponsive. EXAM: CHEST  1 VIEW COMPARISON:  None. FINDINGS: The cardiac silhouette, mediastinal and hilar contours are normal. The lungs are clear. No pleural effusions. No pneumothorax. No pulmonary lesions. The  bony thorax is intact. IMPRESSION: No acute cardiopulmonary findings. Electronically Signed   By: Rudie Meyer M.D.   On: 02/26/2021 19:15   CT Head Wo Contrast  Result Date: 02/26/2021 CLINICAL DATA:  Altered mental status. EXAM: CT HEAD WITHOUT CONTRAST TECHNIQUE: Contiguous axial images were obtained from the base of the skull through the vertex without intravenous contrast. COMPARISON:  None. FINDINGS: Brain: No evidence of acute infarction, hemorrhage, hydrocephalus, extra-axial collection or mass lesion/mass effect. Vascular: No hyperdense vessel or unexpected calcification. Skull: Normal. Negative for fracture or focal lesion. Sinuses/Orbits: No acute finding. Other: None. IMPRESSION: No acute intracranial pathology. Electronically Signed   By: Aram Candela M.D.   On: 02/26/2021 18:54    Procedures Procedures   Medications Ordered in ED Medications  chlordiazePOXIDE (LIBRIUM) capsule 25 mg (has no administration in time range)  thiamine (B-1) injection 100 mg (100 mg Intravenous Given 02/26/21 1738)  sodium chloride 0.9 % bolus 1,000 mL (0 mLs Intravenous Stopped 02/26/21 2103)  chlordiazePOXIDE (LIBRIUM) capsule 25 mg (25 mg Oral Given 02/26/21 2257)  sodium chloride 0.9 % bolus 1,000 mL (1,000 mLs Intravenous New Bag/Given 02/26/21 2303)    ED Course  I have reviewed the triage vital signs and the nursing notes.  Pertinent labs & imaging results that were available during my care of the patient were reviewed by me and considered in my medical decision making (see chart for details).  Clinical Course as of 02/27/21 0023  Caleen Essex Feb 26, 2021  16[address] Father Dr. Mosetta Putt [phone] [AH]  2117 Laurina Fischl- MOther- [phone] [AH]    Clinical Course User Index [AH] Arthor Captain, PA-C   MDM Rules/Calculators/A&P                          40 year old female here with altered mental status and known history of alcohol and polysubstance abuse predominantly of  inhalants.The emergent differential diagnosis for shortness of breath includes, but is not limited to, Pulmonary edema, bronchoconstriction, Pneumonia, Pulmonary embolism, Pneumotherax/ Hemothorax, Dysrythmia, ACS.  I ordered and reviewed labs which included CBC without abnormality, CMP which shows mild hypocalcium he of insignificant value.  Lactic acid elevated likely due to high dehydration.  Ethanol level 370.  Tylenol and salicylate levels negative.  Negative pregnancy test, CBG within normal limits, UDS positive for benzodiazepines.  Ammonia slightly elevated but of insignificant value in the setting of normal liver enzymes.  Urinalysis is negative for evidence of infection.  I ordered and reviewed images which included a 1 view chest x-ray and a head CT which showed no abnormalities.  EKG showed normal sinus rhythm at a rate of 89. As patient primarily appeared to be alcohol intoxicated I allowed her to metabolize the alcohol.  She has been able to ambulate but unfortunately began to have symptoms of withdrawal with tachycardia.  Patient given dose of Librium with improvement in her symptoms.  She is given 2 L of fluids and has normalized.  She is given a dose of Librium prior to discharge and will be discharged with a Librium taper.  I have ordered a peer support consult for the patient to reach out to her tomorrow.  I reviewed PDMP prior to discharge.  She is on Ativan she has  been explicitly advised not to drink or use Ativan while on the Librium taper.  She appears clinically sober and appropriate for discharge at this time. Final Clinical Impression(s) / ED Diagnoses Final diagnoses:  Alcohol abuse with intoxication [facility](HCC)    Rx / DC Orders ED Discharge Orders         Ordered    chlordiazePOXIDE (LIBRIUM) 25 MG capsule        02/26/21 2337           Arthor CaptainHarris, Abigail, PA-C 02/27/21 0023    Rolan BuccoBelfi, Melanie, MD 03/05/21 [phone]

## 2021-02-27 MED ORDER — CHLORDIAZEPOXIDE HCL 25 MG PO CAPS
25.0000 mg | ORAL_CAPSULE | Freq: Once | ORAL | Status: AC
  Administered 2021-02-27: 25 mg via ORAL
  Filled 2021-02-27: qty 1

## 2021-04-10 ENCOUNTER — Emergency Department (HOSPITAL_BASED_OUTPATIENT_CLINIC_OR_DEPARTMENT_OTHER)
Admission: EM | Admit: 2021-04-10 | Discharge: 2021-04-11 | Disposition: A | Discharge status: Home / Self Care | Payer: BC Managed Care – PPO | Attending: Emergency Medicine | Admitting: Emergency Medicine

## 2021-04-10 ENCOUNTER — Encounter (HOSPITAL_BASED_OUTPATIENT_CLINIC_OR_DEPARTMENT_OTHER): Payer: Self-pay

## 2021-04-10 ENCOUNTER — Other Ambulatory Visit: Payer: Self-pay

## 2021-04-10 DIAGNOSIS — Y908 Blood alcohol level of 240 mg/100 ml or more: Secondary | ICD-10-CM | POA: Diagnosis not present

## 2021-04-10 DIAGNOSIS — R112 Nausea with vomiting, unspecified: Secondary | ICD-10-CM | POA: Diagnosis not present

## 2021-04-10 DIAGNOSIS — T5191XA Toxic effect of unspecified alcohol, accidental (unintentional), initial encounter: Secondary | ICD-10-CM | POA: Diagnosis not present

## 2021-04-10 DIAGNOSIS — R109 Unspecified abdominal pain: Secondary | ICD-10-CM | POA: Diagnosis not present

## 2021-04-10 DIAGNOSIS — F10129 Alcohol abuse with intoxication, unspecified: Secondary | ICD-10-CM | POA: Diagnosis not present

## 2021-04-10 DIAGNOSIS — R000 Tachycardia, unspecified: Secondary | ICD-10-CM | POA: Diagnosis not present

## 2021-04-10 DIAGNOSIS — F17200 Nicotine dependence, unspecified, uncomplicated: Secondary | ICD-10-CM | POA: Diagnosis not present

## 2021-04-10 DIAGNOSIS — T496X5A Adverse effect of otorhinolaryngological drugs and preparations, initial encounter: Secondary | ICD-10-CM

## 2021-04-10 LAB — URINALYSIS, ROUTINE W REFLEX MICROSCOPIC
Bilirubin Urine: NEGATIVE
Glucose, UA: NEGATIVE mg/dL
Ketones, ur: NEGATIVE mg/dL
Leukocytes,Ua: NEGATIVE
Nitrite: NEGATIVE
Protein, ur: NEGATIVE mg/dL
Specific Gravity, Urine: 1.015 (ref 1.005–1.030)
pH: 5.5 (ref 5.0–8.0)

## 2021-04-10 LAB — CBC WITH DIFFERENTIAL/PLATELET
Abs Immature Granulocytes: 0 10*3/uL (ref 0.00–0.07)
Basophils Absolute: 0.1 10*3/uL (ref 0.0–0.1)
Basophils Relative: 2 %
Eosinophils Absolute: 0 10*3/uL (ref 0.0–0.5)
Eosinophils Relative: 1 %
HCT: 43.2 % (ref 36.0–46.0)
Hemoglobin: 14.8 g/dL (ref 12.0–15.0)
Immature Granulocytes: 0 %
Lymphocytes Relative: 22 %
Lymphs Abs: 1.4 10*3/uL (ref 0.7–4.0)
MCH: 30.3 pg (ref 26.0–34.0)
MCHC: 34.3 g/dL (ref 30.0–36.0)
MCV: 88.3 fL (ref 80.0–100.0)
Monocytes Absolute: 0.3 10*3/uL (ref 0.1–1.0)
Monocytes Relative: 5 %
Neutro Abs: 4.6 10*3/uL (ref 1.7–7.7)
Neutrophils Relative %: 70 %
Platelets: 263 10*3/uL (ref 150–400)
RBC: 4.89 MIL/uL (ref 3.87–5.11)
RDW: 14.8 % (ref 11.5–15.5)
WBC: 6.4 10*3/uL (ref 4.0–10.5)
nRBC: 0 % (ref 0.0–0.2)

## 2021-04-10 LAB — COMPREHENSIVE METABOLIC PANEL
ALT: 43 U/L (ref 0–44)
AST: 45 U/L — ABNORMAL HIGH (ref 15–41)
Albumin: 4.6 g/dL (ref 3.5–5.0)
Alkaline Phosphatase: 58 U/L (ref 38–126)
Anion gap: 15 (ref 5–15)
BUN: 7 mg/dL (ref 6–20)
CO2: 22 mmol/L (ref 22–32)
Calcium: 9 mg/dL (ref 8.9–10.3)
Chloride: 101 mmol/L (ref 98–111)
Creatinine, Ser: 0.57 mg/dL (ref 0.44–1.00)
GFR, Estimated: >60 mL/min (ref >60)
Glucose, Bld: 93 mg/dL (ref 70–99)
Potassium: 3.9 mmol/L (ref 3.5–5.1)
Sodium: 138 mmol/L (ref 135–145)
Total Bilirubin: 0.5 mg/dL (ref 0.3–1.2)
Total Protein: 7.9 g/dL (ref 6.5–8.1)

## 2021-04-10 LAB — PREGNANCY, URINE: Preg Test, Ur: NEGATIVE

## 2021-04-10 LAB — SALICYLATE LEVEL: Salicylate Lvl: <7 mg/dL — ABNORMAL LOW (ref 7.0–30.0)

## 2021-04-10 LAB — ETHANOL: Alcohol, Ethyl (B): 273 mg/dL — ABNORMAL HIGH (ref <10)

## 2021-04-10 MED ORDER — SODIUM CHLORIDE 0.9 % IV BOLUS
1000.0000 mL | Freq: Once | INTRAVENOUS | Status: AC
  Administered 2021-04-10: 1000 mL via INTRAVENOUS

## 2021-04-10 MED ORDER — LORAZEPAM 1 MG PO TABS
2.0000 mg | ORAL_TABLET | Freq: Once | ORAL | Status: AC
  Administered 2021-04-10: 2 mg via ORAL
  Filled 2021-04-10: qty 2

## 2021-04-10 MED ORDER — ONDANSETRON HCL 4 MG/2ML IJ SOLN
4.0000 mg | Freq: Once | INTRAMUSCULAR | Status: AC
  Administered 2021-04-10: 4 mg via INTRAVENOUS
  Filled 2021-04-10: qty 2

## 2021-04-10 NOTE — ED Provider Notes (Signed)
11:14 PM Assumed care from Dr. Rush Landmark, please see their note for full history, physical and decision making until this point. In brief this is a 40 y.o. year old female who presented to the ED tonight with Ingestion (Listerine bottle)     Pending her to return to baseline and PO challenge. Consider librium taper on discharge.   Discharge instructions, including strict return precautions for new or worsening symptoms, given. Patient and/or family verbalized understanding and agreement with the plan as described.   Labs, studies and imaging reviewed by myself and considered in medical decision making if ordered. Imaging interpreted by radiology.  Labs Reviewed  SALICYLATE LEVEL - Abnormal; Notable for the following components:      Result Value   Salicylate Lvl <7.0 (*)    All other components within normal limits  COMPREHENSIVE METABOLIC PANEL - Abnormal; Notable for the following components:   AST 45 (*)    All other components within normal limits  ETHANOL - Abnormal; Notable for the following components:   Alcohol, Ethyl (B) 273 (*)    All other components within normal limits  URINALYSIS, ROUTINE W REFLEX MICROSCOPIC - Abnormal; Notable for the following components:   Color, Urine COLORLESS (*)    Hgb urine dipstick TRACE (*)    All other components within normal limits  CBC WITH DIFFERENTIAL/PLATELET  PREGNANCY, URINE    No orders to display    No follow-ups on file.    Gaylon Melchor, Barbara Cower, MD 04/12/21 [phone]

## 2021-04-10 NOTE — ED Notes (Signed)
RT notified pts RN of abnormally high BP of 155/110.

## 2021-04-10 NOTE — ED Provider Notes (Signed)
[facility] EMERGENCY DEPT Provider Note   CSN: [id number] Arrival date & time: 04/10/21  1956     History Chief Complaint  Patient presents with  . Ingestion    Listerine bottle    Taylor [name] is a 40 y.o. female.  The history is provided by the patient and medical records. No language interpreter was used.  Alcohol Intoxication This is a recurrent problem. The current episode started 1 to 2 hours ago. The problem occurs constantly. The problem has been rapidly improving. Associated symptoms include abdominal pain. Pertinent negatives include no chest pain, no headaches and no shortness of breath. Nothing aggravates the symptoms. Nothing relieves the symptoms. She has tried nothing for the symptoms. The treatment provided no relief.       Past Medical History:  Diagnosis Date  . Anemia   . Anxiety   . Depression    welbutrin - stopped taking it early pregn but will be back on it after    Patient Active Problem List   Diagnosis Date Noted  . Delayed delivery after SROM (spontaneous rupture of membranes) 11/27/2017  . SVD (spontaneous vaginal delivery) 11/27/2017  . Postpartum care following vaginal delivery (12/10) 11/27/2017  . Second-degree perineal laceration, with delivery 11/27/2017  . PROM (premature rupture of membranes) 11/26/2017  . Generalized anxiety disorder 01/30/2017  . Adderall use disorder, mild, abuse (HCC) 06/27/2016  . Convulsions/seizures--[facility] [place] 07/2015--etoh!! 08/01/2015  . Alcohol use disorder, severe, in early remission, dependence (HCC) 08/01/2015  . PMDD (premenstrual dysphoric disorder) 10/16/2013  . Attention deficit hyperactivity disorder (ADHD), combined type, severe 05/08/2013  . Depression, reactive 05/08/2013    Past Surgical History:  Procedure Laterality Date  . KNEE SURGERY     right knee surgery 4 times - soccer     OB History    Gravida  1   Para  1   Term  1   Preterm      AB      Living  1      SAB      IAB      Ectopic      Multiple  0   Live Births  1           Family History  Problem Relation Age of Onset  . Alcohol abuse Mother   . Drug abuse Mother   . Hypertension Mother   . Heart disease Mother   . Miscarriages / [place] Mother   . Arthritis Father   . Asthma Father   . Depression Father   . Hypertension Father   . Varicose Veins Father   . Asthma Sister   . Learning disabilities Sister   . Alcohol abuse Brother   . Depression Brother   . Drug abuse Brother   . Hypertension Brother   . Learning disabilities Brother   . Mental illness Brother   . Alcohol abuse Maternal Aunt   . Depression Maternal Aunt   . Drug abuse Maternal Aunt   . Mental illness Maternal Aunt   . Alcohol abuse Maternal Uncle   . Depression Paternal Aunt   . Depression Paternal Uncle   . Depression Maternal Grandmother   . Heart disease Maternal Grandmother   . Alcohol abuse Maternal Grandfather   . Depression Maternal Grandfather   . Depression Paternal Grandmother   . Heart disease Paternal Grandmother   . Depression Paternal Grandfather     Social History   Tobacco Use  . Smoking status: Current Some  Day Smoker    Packs/day: 1.50    Years: 4.00    Pack years: 6.00    Types: Cigarettes  . Smokeless tobacco: Never Used  . Tobacco comment: cutting back   Vaping Use  . Vaping Use: Some days  Substance Use Topics  . Alcohol use: Yes    Alcohol/week: 6.0 standard drinks    Types: 6 Standard drinks or equivalent per week    Comment: past 5 years vodka and beer  . Drug use: No    Home Medications Prior to Admission medications   Medication Sig Start Date End Date Taking? Authorizing Provider  acetaminophen (TYLENOL) 500 MG tablet Take 1,000 mg by mouth every 6 (six) hours as needed for mild pain or headache.    [provider]  ALPRAZolam Prudy Feeler) 0.25 MG tablet Take 0.5 mg by mouth at bedtime. 02/10/21   [provider]   amphetamine-dextroamphetamine (ADDERALL) 20 MG tablet Take 1 tablet (20 mg total) by mouth 2 (two) times daily for 30 days. 05/01/19 05/31/19  Chauncey Mann, MD  amphetamine-dextroamphetamine (ADDERALL) 20 MG tablet Take 1 tablet (20 mg total) by mouth 2 (two) times daily for 30 days. 05/30/19 06/29/19  Chauncey Mann, MD  amphetamine-dextroamphetamine (ADDERALL) 20 MG tablet Take 1 tablet (20 mg total) by mouth 2 (two) times daily. Patient not taking: No sig reported 06/25/19   Chauncey Mann, MD  amphetamine-dextroamphetamine (ADDERALL) 30 MG tablet Take 20 mg by mouth in the morning and at bedtime. Take 2/3 tablet (20 mg) BID 01/29/21   [provider]  benzocaine-Menthol (DERMOPLAST) 20-0.5 % AERO Apply 1 application topically as needed for irritation (perineal discomfort). Patient not taking: No sig reported 11/29/17   Neta Mends, CNM  buPROPion (WELLBUTRIN XL) 300 MG 24 hr tablet Take 1 tablet (300 mg total) by mouth daily after breakfast. 06/13/19   Chauncey Mann, MD  chlordiazePOXIDE (LIBRIUM) 25 MG capsule  PO TID x 2 D, then  PO BID X 2 D, then 25 BID x 2 D, then 25 mg daily x 2 D 02/26/21   Harris, Abigail, PA-C  coconut oil OIL Apply 1 application topically as needed. Patient not taking: No sig reported 11/29/17   Neta Mends, CNM  disulfiram (ANTABUSE) 250 MG tablet Take 250 mg by mouth every evening. 12/19/20   [provider]  drospirenone-ethinyl estradiol (YAZ) 3-0.02 MG tablet Take 1 tablet by mouth every evening. 05/17/19   [provider]  ibuprofen (ADVIL,MOTRIN) 800 MG tablet Take 1 tablet (800 mg total) by mouth every 6 (six) hours. Patient not taking: No sig reported 11/29/17   Neta Mends, CNM  ondansetron (ZOFRAN) 4 MG tablet Take 1 tablet (4 mg total) by mouth every 6 (six) hours. Patient not taking: No sig reported 02/05/19   Kendrick, Caitlyn S, PA-C    Allergies    Bee venom  Review of Systems   Review of Systems   Constitutional: Negative for chills, fatigue and fever.  HENT: Negative for congestion.   Eyes: Negative for visual disturbance.  Respiratory: Negative for cough, chest tightness, shortness of breath and wheezing.   Cardiovascular: Negative for chest pain, palpitations and leg swelling.  Gastrointestinal: Positive for abdominal pain, nausea and vomiting. Negative for abdominal distention, constipation and diarrhea.  Genitourinary: Negative for dysuria.  Musculoskeletal: Negative for back pain, neck pain and neck stiffness.  Neurological: Negative for dizziness, weakness, light-headedness, numbness and headaches. Seizures: hx of but non today.  Psychiatric/Behavioral: Negative for agitation and confusion. The patient is nervous/anxious.   All other systems reviewed and are negative.   Physical Exam Updated Vital Signs BP (!) 169/110 (BP Location: Left Arm)   Pulse (!) 101   Temp 98.5 F (36.9 C) (Oral)   Resp 20   Ht 5\' 2"  (1.575 m)   Wt 56.8 kg   SpO2 99%   BMI 22.90 kg/m   Physical Exam Vitals and nursing note reviewed.  Constitutional:      General: She is not in acute distress.    Appearance: She is well-developed. She is not ill-appearing, toxic-appearing or diaphoretic.  HENT:     Head: Normocephalic and atraumatic.     Nose: No congestion or rhinorrhea.     Mouth/Throat:     Mouth: Mucous membranes are moist.     Pharynx: No oropharyngeal exudate or posterior oropharyngeal erythema.  Eyes:     Extraocular Movements: Extraocular movements intact.     Conjunctiva/sclera: Conjunctivae normal.     Pupils: Pupils are equal, round, and reactive to light.  Cardiovascular:     Rate and Rhythm: Regular rhythm. Tachycardia present.     Heart sounds: No murmur heard.   Pulmonary:     Effort: Pulmonary effort is normal. No respiratory distress.     Breath sounds: Normal breath sounds.  Abdominal:     General: Abdomen is flat.     Palpations: Abdomen is soft.      Tenderness: There is no abdominal tenderness. There is no right CVA tenderness, left CVA tenderness, guarding or rebound.  Musculoskeletal:        General: No tenderness.     Cervical back: Neck supple. No tenderness.     Right lower leg: No edema.     Left lower leg: No edema.  Skin:    General: Skin is warm and dry.     Capillary Refill: Capillary refill takes less than 2 seconds.     Coloration: Skin is not pale.     Findings: No erythema.  Neurological:     General: No focal deficit present.     Mental Status: She is alert and oriented to person, place, and time.     Sensory: No sensory deficit.     Motor: No weakness.  Psychiatric:        Attention and Perception: Attention and perception normal.        Mood and Affect: Mood is anxious.        Behavior: Behavior is not agitated.        Thought Content: Thought content is not paranoid. Thought content does not include homicidal or suicidal ideation. Thought content does not include homicidal or suicidal plan.     ED Results / Procedures / Treatments   Labs (all labs ordered are listed, but only abnormal results are displayed) Labs Reviewed  SALICYLATE LEVEL - Abnormal; Notable for the following components:      Result Value   Salicylate Lvl <7.0 (*)    All other components within normal limits  COMPREHENSIVE METABOLIC PANEL - Abnormal; Notable for the following components:   AST 45 (*)    All other components within normal limits  ETHANOL - Abnormal; Notable for the following components:   Alcohol, Ethyl (B) 273 (*)    All other components within normal limits  URINALYSIS, ROUTINE W REFLEX MICROSCOPIC - Abnormal; Notable for the following components:   Color, Urine COLORLESS (*)    Hgb urine dipstick TRACE (*)  All other components within normal limits  CBC WITH DIFFERENTIAL/PLATELET  PREGNANCY, URINE    EKG None  Radiology No results found.  Procedures Procedures   Medications Ordered in ED Medications   sodium chloride 0.9 % bolus 1,000 mL (0 mLs Intravenous Stopped 04/10/21 2232)    ED Course  I have reviewed the triage vital signs and the nursing notes.  Pertinent labs & imaging results that were available during my care of the patient were reviewed by me and considered in my medical decision making (see chart for details).    MDM Rules/Calculators/A&P                          Taylor [name] is a 40 y.o. female with a past medical history significant for depression, ADHD, and alcohol abuse with prior alcohol withdrawal seizures who presents with Listerine ingestion with subsequent nausea, vomiting, and malaise.  Patient reports that she has been sober for over 60 days until she start drinking wine yesterday.  She reports that today, she needed something to drink so grabbed a bottle of Listerine cool mint and drank approximately three fourths of the bottle.  She reports shortly thereafter, she started having  violent nausea and vomiting without any blood.  She reports she vomited everything that was in her stomach until she was dry heaving.  She reports that she felt very intoxicated and had malaise.  She reports some abdominal aching that has started to improve.  She reports that after calling EMS, she was brought in for evaluation.  Patient now says that she is not feeling intoxicated and does not have as much nausea or vomiting still has some mild abdominal cramping from all the recent vomiting.  She reports she is actually feeling somewhat shaky like she might have a seizure from alcohol withdrawal.  Patient reports has been several years since she had alcohol withdrawal but it did happen within 24 hours of stopping alcohol use for her.  She denies any other pains or complaints at this time.  On exam, lungs are clear and chest is nontender.  Abdomen is nontender.  Normal bowel sounds.  No focal neurologic deficits on my exam.  She does not appear clinically intoxicated.  She denies SI or HI  to me and this was not attempt to herself whatsoever.   I called poison control and got the recommendations based on the ingestion of the cool mint Listerine with salicylate and it.  They recommended getting a salicylate level and some screening blood work.  They recommended fluids and monitoring.  They feel that when she is back to baseline, she will be safe for discharge home and likely does not need prolonged admission or more extensive work-up for alternative alcohol ingestion.  They report she does not need osmolality evaluation or other more complicated alcohol testing.  Had a shared decision made conversation with patient.  We will give the patient fluids and get screening labs and monitor her for withdrawals for several hours.  If she continues to do well, dissipate discharge home.  Do not feel she needs IVC or any psychiatric involvement at this time given that this was not an attempt to hurt her self and instead was an attempt to get intoxicated.  Patient agrees with plan of care, anticipate reassessment after workup.   Care transferred to oncoming team awaiting for reassessment.  Anticipate discharge if patient is back to baseline and nausea is improved and  she is feeling better   Final Clinical Impression(s) / ED Diagnoses Final diagnoses:  Adverse effect of mouthwash  Non-intractable vomiting with nausea, unspecified vomiting type    Clinical Impression: 1. Adverse effect of mouthwash   2. Non-intractable vomiting with nausea, unspecified vomiting type     Disposition: Care transferred to oncoming team awaiting for reassessment.  Anticipate discharge if patient is back to baseline and nausea is improved and she is feeling better   This note was prepared with assistance of Dragon voice recognition software. Occasional wrong-word or sound-a-like substitutions may have occurred due to the inherent limitations of voice recognition software.      Kaoir Loree, Canary Brimhristopher J,  MD 04/10/21 [phone]

## 2021-04-10 NOTE — ED Notes (Signed)
RT attempted to place PIV in left hand per pt request, blood return, blood vessel blew. Second attempt successful in left forearm w/out difficulty. Flushed/secured/labs drawn.

## 2021-04-10 NOTE — ED Triage Notes (Addendum)
Pt was BIBA after ingesting three-fourths of a one liter Listerine (cool mint) bottle at appx. 1800. Pt states that she relapsed yesterday from alcohol and did not have any alcohol in the home so she drank the Listerine bottle. Pt denies SI. Pt states immediately after drinking it she began to sweat profusely but currently denies any sx.   CBG- 90 per EMS and they gave her some orange juice. Last alcoholic beverage was yesterday and was clean for 60 days before.

## 2021-04-10 NOTE — ED Notes (Signed)
RT notified RN of pts elevated BP 140/101.

## 2021-04-11 MED ORDER — CHLORDIAZEPOXIDE HCL 25 MG PO CAPS: ORAL_CAPSULE | ORAL | 0 refills | Status: DC

## 2021-04-11 MED ORDER — ONDANSETRON 4 MG PO TBDP: 4.0000 mg | ORAL_TABLET | Freq: Three times a day (TID) | ORAL | 0 refills | Status: DC | PRN

## 2021-07-15 ENCOUNTER — Emergency Department (HOSPITAL_COMMUNITY)
Admission: EM | Admit: 2021-07-15 | Discharge: 2021-07-15 | Discharge status: Against Medical Advice | Payer: BC Managed Care – PPO | Attending: Student | Admitting: Student

## 2021-07-15 ENCOUNTER — Other Ambulatory Visit: Payer: Self-pay

## 2021-07-15 ENCOUNTER — Encounter (HOSPITAL_COMMUNITY): Payer: Self-pay | Admitting: Emergency Medicine

## 2021-07-15 DIAGNOSIS — F418 Other specified anxiety disorders: Secondary | ICD-10-CM | POA: Diagnosis not present

## 2021-07-15 DIAGNOSIS — Z5321 Procedure and treatment not carried out due to patient leaving prior to being seen by health care provider: Secondary | ICD-10-CM | POA: Insufficient documentation

## 2021-07-15 DIAGNOSIS — F1010 Alcohol abuse, uncomplicated: Secondary | ICD-10-CM | POA: Insufficient documentation

## 2021-07-15 DIAGNOSIS — F329 Major depressive disorder, single episode, unspecified: Secondary | ICD-10-CM | POA: Diagnosis present

## 2021-07-15 DIAGNOSIS — Y908 Blood alcohol level of 240 mg/100 ml or more: Secondary | ICD-10-CM | POA: Insufficient documentation

## 2021-07-15 DIAGNOSIS — F32A Depression, unspecified: Secondary | ICD-10-CM

## 2021-07-15 DIAGNOSIS — Z79899 Other long term (current) drug therapy: Secondary | ICD-10-CM | POA: Diagnosis not present

## 2021-07-15 LAB — COMPREHENSIVE METABOLIC PANEL
ALT: 34 U/L (ref 0–44)
AST: 38 U/L (ref 15–41)
Albumin: 4.2 g/dL (ref 3.5–5.0)
Alkaline Phosphatase: 58 U/L (ref 38–126)
Anion gap: 11 (ref 5–15)
BUN: 9 mg/dL (ref 6–20)
CO2: 22 mmol/L (ref 22–32)
Calcium: 8.7 mg/dL — ABNORMAL LOW (ref 8.9–10.3)
Chloride: 107 mmol/L (ref 98–111)
Creatinine, Ser: 0.66 mg/dL (ref 0.44–1.00)
GFR, Estimated: >60 mL/min (ref >60)
Glucose, Bld: 95 mg/dL (ref 70–99)
Potassium: 3.5 mmol/L (ref 3.5–5.1)
Sodium: 140 mmol/L (ref 135–145)
Total Bilirubin: 0.4 mg/dL (ref 0.3–1.2)
Total Protein: 7.8 g/dL (ref 6.5–8.1)

## 2021-07-15 LAB — I-STAT BETA HCG BLOOD, ED (MC, WL, AP ONLY): I-stat hCG, quantitative: <5 m[IU]/mL (ref <5)

## 2021-07-15 LAB — CBC WITH DIFFERENTIAL/PLATELET
Abs Immature Granulocytes: 0.01 10*3/uL (ref 0.00–0.07)
Basophils Absolute: 0.1 10*3/uL (ref 0.0–0.1)
Basophils Relative: 2 %
Eosinophils Absolute: 0.1 10*3/uL (ref 0.0–0.5)
Eosinophils Relative: 1 %
HCT: 41.6 % (ref 36.0–46.0)
Hemoglobin: 14.3 g/dL (ref 12.0–15.0)
Immature Granulocytes: 0 %
Lymphocytes Relative: 37 %
Lymphs Abs: 1.8 10*3/uL (ref 0.7–4.0)
MCH: 31.3 pg (ref 26.0–34.0)
MCHC: 34.4 g/dL (ref 30.0–36.0)
MCV: 91 fL (ref 80.0–100.0)
Monocytes Absolute: 0.3 10*3/uL (ref 0.1–1.0)
Monocytes Relative: 6 %
Neutro Abs: 2.6 10*3/uL (ref 1.7–7.7)
Neutrophils Relative %: 54 %
Platelets: 295 10*3/uL (ref 150–400)
RBC: 4.57 MIL/uL (ref 3.87–5.11)
RDW: 15.7 % — ABNORMAL HIGH (ref 11.5–15.5)
WBC: 4.9 10*3/uL (ref 4.0–10.5)
nRBC: 0 % (ref 0.0–0.2)

## 2021-07-15 LAB — RAPID URINE DRUG SCREEN, HOSP PERFORMED
Amphetamines: POSITIVE — AB
Barbiturates: NOT DETECTED
Benzodiazepines: POSITIVE — AB
Cocaine: NOT DETECTED
Opiates: NOT DETECTED
Tetrahydrocannabinol: NOT DETECTED

## 2021-07-15 LAB — ETHANOL: Alcohol, Ethyl (B): 343 mg/dL (ref <10)

## 2021-07-15 NOTE — ED Notes (Signed)
Called pts name 3x for updated VS with no response.  

## 2021-07-15 NOTE — ED Provider Notes (Signed)
Emergency Medicine Provider Triage Evaluation Note  Taylor [name] , a 40 y.o. female  was evaluated in triage.  Pt complains of depression, states that she is feeling down today, she is a alcoholic, she was 90 days clean until Monday where she started to drink again.  She states her last 3 days she has been drinking about 7 airplane bottles of liquor daily, last drink was approximately 6 hours ago, she endorses that she used to drink about half a pint daily, she has had multiple alcoholic withdrawal seizures, and has been hospitalized for these.  She denies suicidal or homicidal ideations, denies hallucinations or delusions.  She is just upset that she started to drink again..  Review of Systems  Positive: Depression, anxiety Negative: Suicidal homicidal ideations  Physical Exam  BP (!) 138/110 (BP Location: Left Arm)   Pulse (!) 120   Temp 98.4 F (36.9 C) (Oral)   Resp 16   SpO2 94%  Gen:   Awake, no distress   Resp:  Normal effort  MSK:   Moves extremities without difficulty  Other:    Medical Decision Making  Medically screening exam initiated at 4:15 PM.  Appropriate orders placed.  Taylor [name] was informed that the remainder of the evaluation will be completed by another provider, this initial triage assessment does not replace that evaluation, and the importance of remaining in the ED until their evaluation is complete.  Presents with depression and anxiety, due to her history of severe alcohol withdrawals will make her a level two recommend rooming for further evaluation.   Carroll Sage, PA-C 07/15/21 1618    Alvira Monday, MD 07/15/21 [phone]

## 2021-07-15 NOTE — ED Triage Notes (Signed)
Pt reports feeling depressed, thinks it may be related to her alcohol use. States she drinks everyday, last drink today. Denies SI/HI.

## 2021-07-15 NOTE — ED Notes (Signed)
Pt reports hx alcohol withdrawal seizures.

## 2021-10-22 ENCOUNTER — Other Ambulatory Visit: Payer: Self-pay

## 2021-10-22 DIAGNOSIS — R112 Nausea with vomiting, unspecified: Secondary | ICD-10-CM | POA: Insufficient documentation

## 2021-10-22 DIAGNOSIS — F17210 Nicotine dependence, cigarettes, uncomplicated: Secondary | ICD-10-CM | POA: Insufficient documentation

## 2021-10-22 DIAGNOSIS — Z20822 Contact with and (suspected) exposure to covid-19: Secondary | ICD-10-CM | POA: Insufficient documentation

## 2021-10-22 DIAGNOSIS — R310 Gross hematuria: Secondary | ICD-10-CM | POA: Diagnosis not present

## 2021-10-22 DIAGNOSIS — R197 Diarrhea, unspecified: Secondary | ICD-10-CM | POA: Diagnosis not present

## 2021-10-22 DIAGNOSIS — R319 Hematuria, unspecified: Secondary | ICD-10-CM | POA: Diagnosis present

## 2021-10-22 NOTE — ED Triage Notes (Signed)
Pt POV reports diarrhea with "red splotches", blood in urine. Also reports hematemesis x2 days. Pt reports scant amount of blood in emesis. Reports this feels similar to kidney infections in past.

## 2021-10-23 ENCOUNTER — Emergency Department (HOSPITAL_BASED_OUTPATIENT_CLINIC_OR_DEPARTMENT_OTHER)
Admission: EM | Admit: 2021-10-23 | Discharge: 2021-10-23 | Disposition: A | Discharge status: Home / Self Care | Payer: BC Managed Care – PPO | Attending: Emergency Medicine | Admitting: Emergency Medicine

## 2021-10-23 DIAGNOSIS — R197 Diarrhea, unspecified: Secondary | ICD-10-CM

## 2021-10-23 DIAGNOSIS — R310 Gross hematuria: Secondary | ICD-10-CM

## 2021-10-23 DIAGNOSIS — R112 Nausea with vomiting, unspecified: Secondary | ICD-10-CM

## 2021-10-23 LAB — URINALYSIS, ROUTINE W REFLEX MICROSCOPIC
Bilirubin Urine: NEGATIVE
Glucose, UA: NEGATIVE mg/dL
Hgb urine dipstick: NEGATIVE
Leukocytes,Ua: NEGATIVE
Nitrite: NEGATIVE
Protein, ur: NEGATIVE mg/dL
Specific Gravity, Urine: 1.017 (ref 1.005–1.030)
pH: 6 (ref 5.0–8.0)

## 2021-10-23 LAB — CBC WITH DIFFERENTIAL/PLATELET
Abs Immature Granulocytes: 0.02 10*3/uL (ref 0.00–0.07)
Basophils Absolute: 0.1 10*3/uL (ref 0.0–0.1)
Basophils Relative: 2 %
Eosinophils Absolute: 0.2 10*3/uL (ref 0.0–0.5)
Eosinophils Relative: 6 %
HCT: 41.8 % (ref 36.0–46.0)
Hemoglobin: 14.6 g/dL (ref 12.0–15.0)
Immature Granulocytes: 1 %
Lymphocytes Relative: 42 %
Lymphs Abs: 1.9 10*3/uL (ref 0.7–4.0)
MCH: 31.3 pg (ref 26.0–34.0)
MCHC: 34.9 g/dL (ref 30.0–36.0)
MCV: 89.7 fL (ref 80.0–100.0)
Monocytes Absolute: 0.4 10*3/uL (ref 0.1–1.0)
Monocytes Relative: 9 %
Neutro Abs: 1.7 10*3/uL (ref 1.7–7.7)
Neutrophils Relative %: 40 %
Platelets: 82 10*3/uL — ABNORMAL LOW (ref 150–400)
RBC: 4.66 MIL/uL (ref 3.87–5.11)
RDW: 13.7 % (ref 11.5–15.5)
WBC: 4.3 10*3/uL (ref 4.0–10.5)
nRBC: 0 % (ref 0.0–0.2)

## 2021-10-23 LAB — COMPREHENSIVE METABOLIC PANEL
ALT: 54 U/L — ABNORMAL HIGH (ref 0–44)
AST: 68 U/L — ABNORMAL HIGH (ref 15–41)
Albumin: 4 g/dL (ref 3.5–5.0)
Alkaline Phosphatase: 75 U/L (ref 38–126)
Anion gap: 17 — ABNORMAL HIGH (ref 5–15)
BUN: 8 mg/dL (ref 6–20)
CO2: 22 mmol/L (ref 22–32)
Calcium: 8.8 mg/dL — ABNORMAL LOW (ref 8.9–10.3)
Chloride: 100 mmol/L (ref 98–111)
Creatinine, Ser: 0.48 mg/dL (ref 0.44–1.00)
GFR, Estimated: >60 mL/min (ref >60)
Glucose, Bld: 120 mg/dL — ABNORMAL HIGH (ref 70–99)
Potassium: 3.1 mmol/L — ABNORMAL LOW (ref 3.5–5.1)
Sodium: 139 mmol/L (ref 135–145)
Total Bilirubin: 0.3 mg/dL (ref 0.3–1.2)
Total Protein: 7.2 g/dL (ref 6.5–8.1)

## 2021-10-23 LAB — RESP PANEL BY RT-PCR (FLU A&B, COVID) ARPGX2
Influenza A by PCR: NEGATIVE
Influenza B by PCR: NEGATIVE
SARS Coronavirus 2 by RT PCR: NEGATIVE

## 2021-10-23 LAB — PREGNANCY, URINE: Preg Test, Ur: NEGATIVE

## 2021-10-23 LAB — GROUP A STREP BY PCR: Group A Strep by PCR: NOT DETECTED

## 2021-10-23 MED ORDER — ONDANSETRON 8 MG PO TBDP: 8.0000 mg | ORAL_TABLET | Freq: Three times a day (TID) | ORAL | 0 refills | Status: DC | PRN

## 2021-10-23 MED ORDER — ONDANSETRON HCL 4 MG/2ML IJ SOLN
4.0000 mg | Freq: Once | INTRAMUSCULAR | Status: AC
  Administered 2021-10-23: 4 mg via INTRAVENOUS
  Filled 2021-10-23: qty 2

## 2021-10-23 MED ORDER — POTASSIUM CHLORIDE CRYS ER 20 MEQ PO TBCR: 40.0000 meq | EXTENDED_RELEASE_TABLET | Freq: Once | ORAL | Status: DC

## 2021-10-23 MED ORDER — LACTATED RINGERS IV BOLUS
1000.0000 mL | Freq: Once | INTRAVENOUS | Status: AC
  Administered 2021-10-23: 1000 mL via INTRAVENOUS

## 2021-10-23 NOTE — ED Provider Notes (Signed)
[facility] Provider Note: Taylor Dell, MD, FACEP  CSN: [id number] MRN: [id number] ARRIVAL: 10/22/21 at 1806 ROOM: DB014/DB014   CHIEF COMPLAINT  Hematuria   HISTORY OF PRESENT ILLNESS  10/23/21 12:59 AM Taylor [name] is a 40 y.o. female who developed a sore throat about 6 days ago.  This was followed by nausea, vomiting and diarrhea.  The diarrhea has been nonbloody.  She has noticed some blood streaking in the emesis starting 2 days ago.  She has also noted blood in her urine which has now resolved.  She has had associated abdominal cramping, cough and body aches.  She rates her body aches as a 4 out of 10.  She has also had blood streaked nasal secretions.   Past Medical History:  Diagnosis Date   Anemia    Anxiety    Depression    welbutrin - stopped taking it early pregn but will be back on it after    Past Surgical History:  Procedure Laterality Date   KNEE SURGERY     right knee surgery 4 times - soccer    Family History  Problem Relation Age of Onset   Alcohol abuse Mother    Drug abuse Mother    Hypertension Mother    Heart disease Mother    Miscarriages / [place] Mother    Arthritis Father    Asthma Father    Depression Father    Hypertension Father    Varicose Veins Father    Asthma Sister    Learning disabilities Sister    Alcohol abuse Brother    Depression Brother    Drug abuse Brother    Hypertension Brother    Learning disabilities Brother    Mental illness Brother    Alcohol abuse Maternal Aunt    Depression Maternal Aunt    Drug abuse Maternal Aunt    Mental illness Maternal Aunt    Alcohol abuse Maternal Uncle    Depression Paternal Aunt    Depression Paternal Uncle    Depression Maternal Grandmother    Heart disease Maternal Grandmother    Alcohol abuse Maternal Grandfather    Depression Maternal Grandfather    Depression Paternal Grandmother    Heart disease Paternal Grandmother    Depression Paternal Grandfather      Social History   Tobacco Use   Smoking status: Some Days    Packs/day: 1.50    Years: 4.00    Pack years: 6.00    Types: Cigarettes   Smokeless tobacco: Never   Tobacco comments:    cutting back   Vaping Use   Vaping Use: Some days  Substance Use Topics   Alcohol use: Yes    Alcohol/week: 6.0 standard drinks    Types: 6 Standard drinks or equivalent per week    Comment: past 5 years vodka and beer   Drug use: No    Prior to Admission medications   Medication Sig Start Date End Date Taking? Authorizing Provider  ondansetron (ZOFRAN ODT) 8 MG disintegrating tablet Take 1 tablet (8 mg total) by mouth every 8 (eight) hours as needed for nausea or vomiting. 10/23/21  Yes Gardner Servantes, MD  ALPRAZolam Prudy Feeler) 0.25 MG tablet Take 0.5 mg by mouth at bedtime. 02/10/21   [provider]  amphetamine-dextroamphetamine (ADDERALL) 30 MG tablet Take 20 mg by mouth in the morning and at bedtime. Take 2/3 tablet (20 mg) BID 01/29/21   [provider]  buPROPion (WELLBUTRIN XL) 300 MG 24  hr tablet Take 1 tablet (300 mg total) by mouth daily after breakfast. 06/13/19   Delight Hoh, MD  chlordiazePOXIDE (LIBRIUM) 25 MG capsule 25-50mg  PO TID x 2D, then 25-50mg  PO BID X 2D, then 25-50mg  PO QD X 1D 04/11/21   Mesner, Corene Cornea, MD  disulfiram (ANTABUSE) 250 MG tablet Take 250 mg by mouth every evening. 12/19/20   [provider]  drospirenone-ethinyl estradiol (YAZ) 3-0.02 MG tablet Take 1 tablet by mouth every evening. 05/17/19   [provider]    Allergies Bee venom   REVIEW OF SYSTEMS  Negative except as noted here or in the History of Present Illness.   PHYSICAL EXAMINATION  Initial Vital Signs Blood pressure (!) 151/111, pulse 100, temperature 98.4 F (36.9 C), resp. rate 20, height 5\' 2"  (1.575 m), weight 56.7 kg, SpO2 99 %, unknown if currently breastfeeding.  Examination General: Well-developed, well-nourished female in no acute distress;  appearance consistent with age of record HENT: normocephalic; atraumatic; mild pharyngeal erythema without edema or exudate Eyes: pupils equal, round and reactive to light; extraocular muscles intact Neck: supple Heart: regular rate and rhythm Lungs: clear to auscultation bilaterally Abdomen: soft; nondistended; mild suprapubic tenderness; bowel sounds present Extremities: No deformity; full range of motion; pulses normal Neurologic: Awake, alert and oriented; motor function intact in all extremities and symmetric; no facial droop Skin: Warm and dry Psychiatric: Normal mood and affect   RESULTS  Summary of this visit's results, reviewed and interpreted by myself:   EKG Interpretation  Date/Time:    Ventricular Rate:    PR Interval:    QRS Duration:   QT Interval:    QTC Calculation:   R Axis:     Text Interpretation:         Laboratory Studies: Results for orders placed or performed during the hospital encounter of 10/23/21 (from the past 24 hour(s))  Urinalysis, Routine w reflex microscopic Urine, Clean Catch     Status: Abnormal   Collection Time: 10/23/21 12:47 AM  Result Value Ref Range   Color, Urine YELLOW YELLOW   APPearance CLEAR CLEAR   Specific Gravity, Urine 1.017 1.005 - 1.030   pH 6.0 5.0 - 8.0   Glucose, UA NEGATIVE NEGATIVE mg/dL   Hgb urine dipstick NEGATIVE NEGATIVE   Bilirubin Urine NEGATIVE NEGATIVE   Ketones, ur TRACE (A) NEGATIVE mg/dL   Protein, ur NEGATIVE NEGATIVE mg/dL   Nitrite NEGATIVE NEGATIVE   Leukocytes,Ua NEGATIVE NEGATIVE  Pregnancy, urine     Status: None   Collection Time: 10/23/21 12:47 AM  Result Value Ref Range   Preg Test, Ur NEGATIVE NEGATIVE  Resp Panel by RT-PCR (Flu A&B, Covid) Nasopharyngeal Swab     Status: None   Collection Time: 10/23/21  1:02 AM   Specimen: Nasopharyngeal Swab; Nasopharyngeal(NP) swabs in vial transport medium  Result Value Ref Range   SARS Coronavirus 2 by RT PCR NEGATIVE NEGATIVE   Influenza A by  PCR NEGATIVE NEGATIVE   Influenza B by PCR NEGATIVE NEGATIVE  Comprehensive metabolic panel     Status: Abnormal   Collection Time: 10/23/21  1:06 AM  Result Value Ref Range   Sodium 139 135 - 145 mmol/L   Potassium 3.1 (L) 3.5 - 5.1 mmol/L   Chloride 100 98 - 111 mmol/L   CO2 22 22 - 32 mmol/L   Glucose, Bld 120 (H) 70 - 99 mg/dL   BUN 8 6 - 20 mg/dL   Creatinine, Ser 0.48 0.44 - 1.00 mg/dL  Calcium 8.8 (L) 8.9 - 10.3 mg/dL   Total Protein 7.2 6.5 - 8.1 g/dL   Albumin 4.0 3.5 - 5.0 g/dL   AST 68 (H) 15 - 41 U/L   ALT 54 (H) 0 - 44 U/L   Alkaline Phosphatase 75 38 - 126 U/L   Total Bilirubin 0.3 0.3 - 1.2 mg/dL   GFR, Estimated >60 >60 mL/min   Anion gap 17 (H) 5 - 15  Group A Strep by PCR     Status: None   Collection Time: 10/23/21  1:06 AM   Specimen: Urine, Clean Catch; Sterile Swab  Result Value Ref Range   Group A Strep by PCR NOT DETECTED NOT DETECTED  CBC with Differential/Platelet     Status: Abnormal   Collection Time: 10/23/21  1:06 AM  Result Value Ref Range   WBC 4.3 4.0 - 10.5 K/uL   RBC 4.66 3.87 - 5.11 MIL/uL   Hemoglobin 14.6 12.0 - 15.0 g/dL   HCT 41.8 36.0 - 46.0 %   MCV 89.7 80.0 - 100.0 fL   MCH 31.3 26.0 - 34.0 pg   MCHC 34.9 30.0 - 36.0 g/dL   RDW 13.7 11.5 - 15.5 %   Platelets 82 (L) 150 - 400 K/uL   nRBC 0.0 0.0 - 0.2 %   Neutrophils Relative % 40 %   Neutro Abs 1.7 1.7 - 7.7 K/uL   Lymphocytes Relative 42 %   Lymphs Abs 1.9 0.7 - 4.0 K/uL   Monocytes Relative 9 %   Monocytes Absolute 0.4 0.1 - 1.0 K/uL   Eosinophils Relative 6 %   Eosinophils Absolute 0.2 0.0 - 0.5 K/uL   Basophils Relative 2 %   Basophils Absolute 0.1 0.0 - 0.1 K/uL   Immature Granulocytes 1 %   Abs Immature Granulocytes 0.02 0.00 - 0.07 K/uL   Imaging Studies: No results found.  ED COURSE and MDM  Nursing notes, initial and subsequent vitals signs, including pulse oximetry, reviewed and interpreted by myself.  Vitals:   10/23/21 0024 10/23/21 0045 10/23/21 0100  10/23/21 0308  BP: (!) 151/111 (!) 155/101  (!) 142/101  Pulse: 100 100  (!) 102  Resp: 20  18 18   Temp:      SpO2: 99% 99% 98% 99%  Weight:      Height:       Medications  potassium chloride SA (KLOR-CON) CR tablet 40 mEq (has no administration in time range)  ondansetron (ZOFRAN) injection 4 mg (4 mg Intravenous Given 10/23/21 0124)  lactated ringers bolus 1,000 mL (0 mLs Intravenous Stopped 10/23/21 0210)   Patient advised of reassuring laboratory studies.  She does have a mild hypokalemia, likely from GI loss.  There is no evidence of anemia.  There is no evidence of urinary tract infection or hematuria.  The blood streaking in her emesis was likely due to a Mallory-Weiss tear as it occurred later in the course of vomiting.  I do not believe admission is indicated at this time nor any imaging studies indicated.  Her illness may be due to a viral illness but she is negative for strep, COVID and influenza.   PROCEDURES  Procedures   ED DIAGNOSES     ICD-10-CM   1. Nausea, vomiting and diarrhea  R11.2    R19.7     2. Gross hematuria  R31.0          Mayline Dragon, Jenny Reichmann, MD 10/23/21 [phone]

## 2021-12-25 ENCOUNTER — Other Ambulatory Visit: Payer: Self-pay

## 2021-12-25 ENCOUNTER — Inpatient Hospital Stay (HOSPITAL_COMMUNITY)
Admission: EM | Admit: 2021-12-25 | Discharge: 2021-12-28 | DRG: 894 | Discharge status: Against Medical Advice | Payer: BC Managed Care – PPO | Attending: Internal Medicine | Admitting: Internal Medicine

## 2021-12-25 ENCOUNTER — Encounter (HOSPITAL_COMMUNITY): Payer: Self-pay

## 2021-12-25 DIAGNOSIS — K760 Fatty (change of) liver, not elsewhere classified: Secondary | ICD-10-CM | POA: Diagnosis present

## 2021-12-25 DIAGNOSIS — Z818 Family history of other mental and behavioral disorders: Secondary | ICD-10-CM

## 2021-12-25 DIAGNOSIS — E872 Acidosis, unspecified: Secondary | ICD-10-CM | POA: Diagnosis present

## 2021-12-25 DIAGNOSIS — F17210 Nicotine dependence, cigarettes, uncomplicated: Secondary | ICD-10-CM | POA: Diagnosis present

## 2021-12-25 DIAGNOSIS — G9341 Metabolic encephalopathy: Secondary | ICD-10-CM | POA: Diagnosis present

## 2021-12-25 DIAGNOSIS — Y908 Blood alcohol level of 240 mg/100 ml or more: Secondary | ICD-10-CM | POA: Diagnosis present

## 2021-12-25 DIAGNOSIS — I10 Essential (primary) hypertension: Secondary | ICD-10-CM | POA: Diagnosis present

## 2021-12-25 DIAGNOSIS — F10239 Alcohol dependence with withdrawal, unspecified: Principal | ICD-10-CM | POA: Diagnosis present

## 2021-12-25 DIAGNOSIS — Z79899 Other long term (current) drug therapy: Secondary | ICD-10-CM

## 2021-12-25 DIAGNOSIS — F1021 Alcohol dependence, in remission: Secondary | ICD-10-CM

## 2021-12-25 DIAGNOSIS — F10939 Alcohol use, unspecified with withdrawal, unspecified: Secondary | ICD-10-CM | POA: Diagnosis present

## 2021-12-25 DIAGNOSIS — F32A Depression, unspecified: Secondary | ICD-10-CM | POA: Diagnosis present

## 2021-12-25 DIAGNOSIS — Z79890 Hormone replacement therapy (postmenopausal): Secondary | ICD-10-CM

## 2021-12-25 DIAGNOSIS — D709 Neutropenia, unspecified: Secondary | ICD-10-CM | POA: Diagnosis present

## 2021-12-25 DIAGNOSIS — Z8249 Family history of ischemic heart disease and other diseases of the circulatory system: Secondary | ICD-10-CM

## 2021-12-25 DIAGNOSIS — Z716 Tobacco abuse counseling: Secondary | ICD-10-CM

## 2021-12-25 DIAGNOSIS — F411 Generalized anxiety disorder: Secondary | ICD-10-CM | POA: Diagnosis present

## 2021-12-25 DIAGNOSIS — Z7141 Alcohol abuse counseling and surveillance of alcoholic: Secondary | ICD-10-CM

## 2021-12-25 DIAGNOSIS — E876 Hypokalemia: Secondary | ICD-10-CM | POA: Diagnosis present

## 2021-12-25 DIAGNOSIS — Z811 Family history of alcohol abuse and dependence: Secondary | ICD-10-CM

## 2021-12-25 DIAGNOSIS — F10229 Alcohol dependence with intoxication, unspecified: Secondary | ICD-10-CM | POA: Diagnosis present

## 2021-12-25 DIAGNOSIS — Z20822 Contact with and (suspected) exposure to covid-19: Secondary | ICD-10-CM | POA: Diagnosis present

## 2021-12-25 DIAGNOSIS — D696 Thrombocytopenia, unspecified: Secondary | ICD-10-CM | POA: Diagnosis present

## 2021-12-25 DIAGNOSIS — Z91030 Bee allergy status: Secondary | ICD-10-CM

## 2021-12-25 LAB — CBC WITH DIFFERENTIAL/PLATELET
Abs Immature Granulocytes: 0.01 10*3/uL (ref 0.00–0.07)
Basophils Absolute: 0.1 10*3/uL (ref 0.0–0.1)
Basophils Relative: 2 %
Eosinophils Absolute: 0.1 10*3/uL (ref 0.0–0.5)
Eosinophils Relative: 2 %
HCT: 46.1 % — ABNORMAL HIGH (ref 36.0–46.0)
Hemoglobin: 16 g/dL — ABNORMAL HIGH (ref 12.0–15.0)
Immature Granulocytes: 0 %
Lymphocytes Relative: 41 %
Lymphs Abs: 1.7 10*3/uL (ref 0.7–4.0)
MCH: 32 pg (ref 26.0–34.0)
MCHC: 34.7 g/dL (ref 30.0–36.0)
MCV: 92.2 fL (ref 80.0–100.0)
Monocytes Absolute: 0.4 10*3/uL (ref 0.1–1.0)
Monocytes Relative: 9 %
Neutro Abs: 2 10*3/uL (ref 1.7–7.7)
Neutrophils Relative %: 46 %
Platelets: 83 10*3/uL — ABNORMAL LOW (ref 150–400)
RBC: 5 MIL/uL (ref 3.87–5.11)
RDW: 14.6 % (ref 11.5–15.5)
Smear Review: DECREASED
WBC: 4.2 10*3/uL (ref 4.0–10.5)
nRBC: 0 % (ref 0.0–0.2)

## 2021-12-25 LAB — COMPREHENSIVE METABOLIC PANEL
ALT: 35 U/L (ref 0–44)
AST: 49 U/L — ABNORMAL HIGH (ref 15–41)
Albumin: 3.9 g/dL (ref 3.5–5.0)
Alkaline Phosphatase: 66 U/L (ref 38–126)
Anion gap: 16 — ABNORMAL HIGH (ref 5–15)
BUN: 13 mg/dL (ref 6–20)
CO2: 19 mmol/L — ABNORMAL LOW (ref 22–32)
Calcium: 7.9 mg/dL — ABNORMAL LOW (ref 8.9–10.3)
Chloride: 104 mmol/L (ref 98–111)
Creatinine, Ser: 0.62 mg/dL (ref 0.44–1.00)
GFR, Estimated: >60 mL/min (ref >60)
Glucose, Bld: 106 mg/dL — ABNORMAL HIGH (ref 70–99)
Potassium: 3.6 mmol/L (ref 3.5–5.1)
Sodium: 139 mmol/L (ref 135–145)
Total Bilirubin: 0.5 mg/dL (ref 0.3–1.2)
Total Protein: 7.3 g/dL (ref 6.5–8.1)

## 2021-12-25 LAB — I-STAT BETA HCG BLOOD, ED (MC, WL, AP ONLY): I-stat hCG, quantitative: <5 m[IU]/mL (ref <5)

## 2021-12-25 LAB — RESP PANEL BY RT-PCR (FLU A&B, COVID) ARPGX2
Influenza A by PCR: NEGATIVE
Influenza B by PCR: NEGATIVE
SARS Coronavirus 2 by RT PCR: NEGATIVE

## 2021-12-25 LAB — RAPID URINE DRUG SCREEN, HOSP PERFORMED
Amphetamines: NOT DETECTED
Barbiturates: NOT DETECTED
Benzodiazepines: NOT DETECTED
Cocaine: NOT DETECTED
Opiates: NOT DETECTED
Tetrahydrocannabinol: NOT DETECTED

## 2021-12-25 LAB — ETHANOL: Alcohol, Ethyl (B): 488 mg/dL (ref <10)

## 2021-12-25 MED ORDER — LORAZEPAM 2 MG/ML IJ SOLN: 0.0000 mg | Freq: Two times a day (BID) | INTRAMUSCULAR | Status: DC

## 2021-12-25 MED ORDER — SODIUM CHLORIDE 0.9 % IV BOLUS
1000.0000 mL | Freq: Once | INTRAVENOUS | Status: AC
  Administered 2021-12-25: 1000 mL via INTRAVENOUS

## 2021-12-25 MED ORDER — LORAZEPAM 2 MG/ML IJ SOLN
0.0000 mg | Freq: Four times a day (QID) | INTRAMUSCULAR | Status: DC
  Administered 2021-12-25 – 2021-12-27 (×5): 2 mg via INTRAVENOUS
  Filled 2021-12-25 (×5): qty 1

## 2021-12-25 MED ORDER — ACETAMINOPHEN 325 MG PO TABS
650.0000 mg | ORAL_TABLET | ORAL | Status: DC | PRN
  Administered 2021-12-27 – 2021-12-28 (×2): 650 mg via ORAL
  Filled 2021-12-25 (×2): qty 2

## 2021-12-25 MED ORDER — ALUM & MAG HYDROXIDE-SIMETH 200-200-20 MG/5ML PO SUSP: 30.0000 mL | Freq: Four times a day (QID) | ORAL | Status: DC | PRN

## 2021-12-25 MED ORDER — ZOLPIDEM TARTRATE 5 MG PO TABS
5.0000 mg | ORAL_TABLET | Freq: Every evening | ORAL | Status: DC | PRN
  Administered 2021-12-27 – 2021-12-28 (×3): 5 mg via ORAL
  Filled 2021-12-25 (×3): qty 1

## 2021-12-25 MED ORDER — THIAMINE HCL 100 MG PO TABS
100.0000 mg | ORAL_TABLET | Freq: Every day | ORAL | Status: DC
  Administered 2021-12-27 – 2021-12-28 (×2): 100 mg via ORAL
  Filled 2021-12-25 (×2): qty 1

## 2021-12-25 MED ORDER — THIAMINE HCL 100 MG/ML IJ SOLN
100.0000 mg | Freq: Every day | INTRAMUSCULAR | Status: DC
  Administered 2021-12-25 – 2021-12-26 (×2): 100 mg via INTRAVENOUS
  Filled 2021-12-25 (×2): qty 2

## 2021-12-25 MED ORDER — LORAZEPAM 1 MG PO TABS: 0.0000 mg | ORAL_TABLET | Freq: Two times a day (BID) | ORAL | Status: DC

## 2021-12-25 MED ORDER — LORAZEPAM 1 MG PO TABS
0.0000 mg | ORAL_TABLET | Freq: Four times a day (QID) | ORAL | Status: DC
  Administered 2021-12-27: 2 mg via ORAL
  Filled 2021-12-25: qty 2
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
## 2021-12-25 NOTE — ED Provider Notes (Signed)
Patient care assumed at 2330.  Pt with hx/o etoh abuse here with severe intoxication.  Plan to monitor and reassess.    Patient rechecked in the morning.  She states she does not recall how she got here.  She states that she should have a bed available in [place] but she is concerned about DTs.  She is mildly tremulous, mildly tachycardic but has been tachycardic all night.  We will treat with dose of Librium.  Will allow patient to reach out to [facility] regarding bed availability.  Patient care transferred pending reassessment after Librium with anticipation for discharge.   Quintella Reichert, MD 12/26/21 [phone]
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
## 2021-12-26 DIAGNOSIS — Z20822 Contact with and (suspected) exposure to covid-19: Secondary | ICD-10-CM | POA: Diagnosis present

## 2021-12-26 DIAGNOSIS — Z8249 Family history of ischemic heart disease and other diseases of the circulatory system: Secondary | ICD-10-CM | POA: Diagnosis not present

## 2021-12-26 DIAGNOSIS — I10 Essential (primary) hypertension: Secondary | ICD-10-CM | POA: Diagnosis present

## 2021-12-26 DIAGNOSIS — G9341 Metabolic encephalopathy: Secondary | ICD-10-CM | POA: Diagnosis present

## 2021-12-26 DIAGNOSIS — Z79899 Other long term (current) drug therapy: Secondary | ICD-10-CM | POA: Diagnosis not present

## 2021-12-26 DIAGNOSIS — F32A Depression, unspecified: Secondary | ICD-10-CM | POA: Diagnosis present

## 2021-12-26 DIAGNOSIS — Z79890 Hormone replacement therapy (postmenopausal): Secondary | ICD-10-CM | POA: Diagnosis not present

## 2021-12-26 DIAGNOSIS — K760 Fatty (change of) liver, not elsewhere classified: Secondary | ICD-10-CM | POA: Diagnosis present

## 2021-12-26 DIAGNOSIS — F10930 Alcohol use, unspecified with withdrawal, uncomplicated: Secondary | ICD-10-CM

## 2021-12-26 DIAGNOSIS — F411 Generalized anxiety disorder: Secondary | ICD-10-CM | POA: Diagnosis present

## 2021-12-26 DIAGNOSIS — E872 Acidosis, unspecified: Secondary | ICD-10-CM | POA: Diagnosis present

## 2021-12-26 DIAGNOSIS — Z811 Family history of alcohol abuse and dependence: Secondary | ICD-10-CM | POA: Diagnosis not present

## 2021-12-26 DIAGNOSIS — F1021 Alcohol dependence, in remission: Secondary | ICD-10-CM | POA: Diagnosis not present

## 2021-12-26 DIAGNOSIS — D709 Neutropenia, unspecified: Secondary | ICD-10-CM | POA: Diagnosis present

## 2021-12-26 DIAGNOSIS — Y908 Blood alcohol level of 240 mg/100 ml or more: Secondary | ICD-10-CM | POA: Diagnosis present

## 2021-12-26 DIAGNOSIS — E876 Hypokalemia: Secondary | ICD-10-CM | POA: Diagnosis present

## 2021-12-26 DIAGNOSIS — Z91030 Bee allergy status: Secondary | ICD-10-CM | POA: Diagnosis not present

## 2021-12-26 DIAGNOSIS — F10239 Alcohol dependence with withdrawal, unspecified: Secondary | ICD-10-CM | POA: Diagnosis present

## 2021-12-26 DIAGNOSIS — F17210 Nicotine dependence, cigarettes, uncomplicated: Secondary | ICD-10-CM | POA: Diagnosis present

## 2021-12-26 DIAGNOSIS — D696 Thrombocytopenia, unspecified: Secondary | ICD-10-CM | POA: Diagnosis present

## 2021-12-26 DIAGNOSIS — F10939 Alcohol use, unspecified with withdrawal, unspecified: Secondary | ICD-10-CM | POA: Diagnosis present

## 2021-12-26 DIAGNOSIS — Z818 Family history of other mental and behavioral disorders: Secondary | ICD-10-CM | POA: Diagnosis not present

## 2021-12-26 DIAGNOSIS — Z7141 Alcohol abuse counseling and surveillance of alcoholic: Secondary | ICD-10-CM | POA: Diagnosis not present

## 2021-12-26 DIAGNOSIS — F10229 Alcohol dependence with intoxication, unspecified: Secondary | ICD-10-CM | POA: Diagnosis present

## 2021-12-26 DIAGNOSIS — Z716 Tobacco abuse counseling: Secondary | ICD-10-CM | POA: Diagnosis not present

## 2021-12-26 LAB — HIV ANTIBODY (ROUTINE TESTING W REFLEX): HIV Screen 4th Generation wRfx: NONREACTIVE

## 2021-12-26 LAB — MAGNESIUM: Magnesium: 1.8 mg/dL (ref 1.7–2.4)

## 2021-12-26 MED ORDER — BUPROPION HCL ER (XL) 150 MG PO TB24
300.0000 mg | ORAL_TABLET | Freq: Every day | ORAL | Status: DC
  Administered 2021-12-27 – 2021-12-28 (×2): 300 mg via ORAL
  Filled 2021-12-26 (×2): qty 2

## 2021-12-26 MED ORDER — PANTOPRAZOLE SODIUM 40 MG PO TBEC
40.0000 mg | DELAYED_RELEASE_TABLET | Freq: Every day | ORAL | Status: DC
  Administered 2021-12-26 – 2021-12-28 (×3): 40 mg via ORAL
  Filled 2021-12-26 (×3): qty 1

## 2021-12-26 MED ORDER — CHLORDIAZEPOXIDE HCL 25 MG PO CAPS
50.0000 mg | ORAL_CAPSULE | Freq: Once | ORAL | Status: AC
  Administered 2021-12-26: 50 mg via ORAL
  Filled 2021-12-26: qty 2

## 2021-12-26 MED ORDER — SODIUM CHLORIDE 0.9 % IV SOLN: INTRAVENOUS | Status: DC

## 2021-12-26 MED ORDER — PROCHLORPERAZINE EDISYLATE 10 MG/2ML IJ SOLN: 10.0000 mg | Freq: Four times a day (QID) | INTRAMUSCULAR | Status: DC | PRN

## 2021-12-26 MED ORDER — CALCIUM GLUCONATE-NACL 1-0.675 GM/50ML-% IV SOLN
1.0000 g | Freq: Once | INTRAVENOUS | Status: AC
  Administered 2021-12-26: 1000 mg via INTRAVENOUS
  Filled 2021-12-26: qty 50

## 2021-12-26 NOTE — H&P (Signed)
History and Physical    Taylor [name] TGG:[id number] DOB: 07/03/1981 DOA: 12/25/2021  PCP: Barbie Banner, MD  Patient coming from: Home  Chief Complaint: Tremors  HPI: Taylor [name] is a 41 y.o. female with medical history significant of anxiety/depression, EtOH abuse. Presenting with tremors, agitation. She reports that she is trying to quit alcohol. She had a drink yesterday and has felt tremulous and agitated since. She knew she was going to go into withdrawal, so she came to the ED for help. She denies any other aggravating or alleviating factors.    ED Course: She was started on librium, but began vomiting. She remained tremulous. She was started on CIWA. [facility] was called for admission.    Review of Systems:  Denies CP, palpitations, dyspnea, abdominal pain, diarrhea, fever. Reports N/V. Review of systems is otherwise negative for all not mentioned in HPI.   PMHx Past Medical History:  Diagnosis Date   Anemia    Anxiety    Depression    welbutrin - stopped taking it early pregn but will be back on it after    PSHx Past Surgical History:  Procedure Laterality Date   KNEE SURGERY     right knee surgery 4 times - soccer    SocHx  reports that she has been smoking cigarettes. She has a 6.00 pack-year smoking history. She has never used smokeless tobacco. She reports current alcohol use of about 6.0 standard drinks per week. She reports that she does not use drugs.  Allergies  Allergen Reactions   Bee Venom Anaphylaxis and Other (See Comments)    Allergic to Bee stings     FamHx Family History  Problem Relation Age of Onset   Alcohol abuse Mother    Drug abuse Mother    Hypertension Mother    Heart disease Mother    Miscarriages / [place] Mother    Arthritis Father    Asthma Father    Depression Father    Hypertension Father    Varicose Veins Father    Asthma Sister    Learning disabilities Sister    Alcohol abuse Brother    Depression Brother    Drug  abuse Brother    Hypertension Brother    Learning disabilities Brother    Mental illness Brother    Alcohol abuse Maternal Aunt    Depression Maternal Aunt    Drug abuse Maternal Aunt    Mental illness Maternal Aunt    Alcohol abuse Maternal Uncle    Depression Paternal Aunt    Depression Paternal Uncle    Depression Maternal Grandmother    Heart disease Maternal Grandmother    Alcohol abuse Maternal Grandfather    Depression Maternal Grandfather    Depression Paternal Grandmother    Heart disease Paternal Grandmother    Depression Paternal Grandfather     Prior to Admission medications   Medication Sig Start Date End Date Taking? Authorizing Provider  ALPRAZolam Prudy Feeler) 0.25 MG tablet Take 0.5 mg by mouth at bedtime as needed for sleep. 02/10/21  Yes [provider]  amphetamine-dextroamphetamine (ADDERALL) 30 MG tablet Take 20 mg by mouth in the morning and at bedtime. Take 2/3 tablet (20 mg) BID 01/29/21  Yes [provider]  buPROPion (WELLBUTRIN XL) 300 MG 24 hr tablet Take 1 tablet (300 mg total) by mouth daily after breakfast. 06/13/19  Yes Chauncey Mann, MD  drospirenone-ethinyl estradiol (YAZ) 3-0.02 MG tablet Take 1 tablet by mouth every evening. 05/17/19  Yes [provider]  ondansetron (ZOFRAN ODT) 8 MG disintegrating tablet Take 1 tablet (8 mg total) by mouth every 8 (eight) hours as needed for nausea or vomiting. 10/23/21  Yes Molpus, John, MD  pantoprazole (PROTONIX) 40 MG tablet Take 40 mg by mouth daily.   Yes [provider]    Physical Exam: Vitals:   12/26/21 0045 12/26/21 0145 12/26/21 0245 12/26/21 0611  BP: 108/81 122/88 (!) 141/112 (!) 141/101  Pulse: (!) 112 (!) 106 (!) 101 (!) 106  Resp: 20 12 15 16   Temp:      TempSrc:      SpO2: 99% 99% 98% 97%  Weight:      Height:        General: 41 y.o. female resting in bed in NAD Eyes: PERRL, normal sclera ENMT: Nares patent w/o discharge, orophaynx clear, dentition  normal, ears w/o discharge/lesions/ulcers Neck: Supple, trachea midline Cardiovascular: tachy, +S1, S2, no m/g/r, equal pulses throughout Respiratory: CTABL, no w/r/r, normal WOB GI: BS+, NDNT, no masses noted, no organomegaly noted MSK: No e/c/c Neuro: A&O x 3, no focal deficits, tremulous Psyc: Appropriate interaction and affect, agitated but cooperative  Labs on Admission: I have personally reviewed following labs and imaging studies  CBC: Recent Labs  Lab 12/25/21 2101  WBC 4.2  NEUTROABS 2.0  HGB 16.0*  HCT 46.1*  MCV 92.2  PLT 83*   Basic Metabolic Panel: Recent Labs  Lab 12/25/21 2101  NA 139  K 3.6  CL 104  CO2 19*  GLUCOSE 106*  BUN 13  CREATININE 0.62  CALCIUM 7.9*   GFR: Estimated Creatinine Clearance: 73.9 mL/min (by C-G formula based on SCr of 0.62 mg/dL). Liver Function Tests: Recent Labs  Lab 12/25/21 2101  AST 49*  ALT 35  ALKPHOS 66  BILITOT 0.5  PROT 7.3  ALBUMIN 3.9   No results for input(s): LIPASE, AMYLASE in the last 168 hours. No results for input(s): AMMONIA in the last 168 hours. Coagulation Profile: No results for input(s): INR, PROTIME in the last 168 hours. Cardiac Enzymes: No results for input(s): CKTOTAL, CKMB, CKMBINDEX, TROPONINI in the last 168 hours. BNP (last 3 results) No results for input(s): PROBNP in the last 8760 hours. HbA1C: No results for input(s): HGBA1C in the last 72 hours. CBG: No results for input(s): GLUCAP in the last 168 hours. Lipid Profile: No results for input(s): CHOL, HDL, LDLCALC, TRIG, CHOLHDL, LDLDIRECT in the last 72 hours. Thyroid Function Tests: No results for input(s): TSH, T4TOTAL, FREET4, T3FREE, THYROIDAB in the last 72 hours. Anemia Panel: No results for input(s): VITAMINB12, FOLATE, FERRITIN, TIBC, IRON, RETICCTPCT in the last 72 hours. Urine analysis:    Component Value Date/Time   COLORURINE YELLOW 10/23/2021 0047   APPEARANCEUR CLEAR 10/23/2021 0047   LABSPEC 1.017 10/23/2021  0047   PHURINE 6.0 10/23/2021 0047   GLUCOSEU NEGATIVE 10/23/2021 0047   HGBUR NEGATIVE 10/23/2021 0047   BILIRUBINUR NEGATIVE 10/23/2021 0047   KETONESUR TRACE (A) 10/23/2021 0047   PROTEINUR NEGATIVE 10/23/2021 0047   NITRITE NEGATIVE 10/23/2021 0047   LEUKOCYTESUR NEGATIVE 10/23/2021 0047    Radiological Exams on Admission: No results found.  EKG: Independently reviewed. Sinus tach, no st elevation  Assessment/Plan Acute metabolic encephalopathy EtOH withdrawal EtOH abuse High anion gap acidosis     - admit to inpt, progressive     - continue CIWA     - TOC for EtOH abuse; she desires to quit and enter a program     -  thiamine, MVI, folate     - fluids     - anti-emetics  Hypocalcemia     - replace Ca2+, check Mg2+  Anxiety/Depression     - resume home regimen  Thrombocytopenia     - likely secondary to EtOH abuse     - check PBS  DVT prophylaxis: SCDs Code Status: FULL  Family Communication: None at bedside  Consults called: None   Status is: Inpatient  Remains inpatient appropriate because: severity of illness  Teddy Spike DO [facility]  If 7PM-7AM, please contact night-coverage [email or web address]  12/26/2021, 8:27 AM

## 2021-12-26 NOTE — ED Provider Notes (Signed)
°  Physical Exam  BP (!) 141/101    Pulse (!) 106    Temp 98.2 F (36.8 C) (Oral)    Resp 16    Ht 5\' 2"  (1.575 m)    Wt 59 kg    SpO2 97%    BMI 23.78 kg/m   Physical Exam Vitals and nursing note reviewed.  Constitutional:      General: She is not in acute distress.    Appearance: She is well-developed. She is ill-appearing.  HENT:     Head: Normocephalic and atraumatic.  Eyes:     Conjunctiva/sclera: Conjunctivae normal.  Cardiovascular:     Rate and Rhythm: Regular rhythm. Tachycardia present.     Heart sounds: No murmur heard. Pulmonary:     Effort: Pulmonary effort is normal. No respiratory distress.     Breath sounds: Normal breath sounds.  Abdominal:     Palpations: Abdomen is soft.     Tenderness: There is no abdominal tenderness.  Musculoskeletal:        General: No swelling.     Cervical back: Neck supple.  Skin:    General: Skin is warm and dry.     Capillary Refill: Capillary refill takes less than 2 seconds.  Neurological:     Mental Status: She is alert.     Comments: Tremulous  Psychiatric:        Mood and Affect: Mood normal.    Procedures  Procedures  ED Course / MDM   Clinical Course as of 12/26/21 0754  02/23/22 Dec 26, 2021  0712 Alcohol abuse; either admit for Dts or dc with librium [MK]    Clinical Course User Index [MK] Raymont Andreoni, Dec 28, 2021, MD   Medical Decision Making  Patient received a handout.  Initially arrived with alcohol intoxication with strong history of alcohol withdrawal.  Patient initially wanted to go to [facility] there was a plan to try Librium to see if we could get her outpatient alcohol counseling.  However, patient vomiting here in the emergency department with persistent hypertension and tachycardia consistent with alcohol withdrawal.  There is no bed available at [facility] and the patient is beginning to display further signs of alcohol withdrawal.  She will be admitted for alcohol withdrawal.       [facility],  MD 12/26/21 [phone]

## 2021-12-26 NOTE — ED Notes (Signed)
Pt alert, oriented and ambulatory to restroom.

## 2021-12-27 DIAGNOSIS — E876 Hypokalemia: Secondary | ICD-10-CM

## 2021-12-27 DIAGNOSIS — D709 Neutropenia, unspecified: Secondary | ICD-10-CM

## 2021-12-27 DIAGNOSIS — D696 Thrombocytopenia, unspecified: Secondary | ICD-10-CM | POA: Diagnosis present

## 2021-12-27 DIAGNOSIS — F411 Generalized anxiety disorder: Secondary | ICD-10-CM

## 2021-12-27 DIAGNOSIS — F1021 Alcohol dependence, in remission: Secondary | ICD-10-CM

## 2021-12-27 LAB — PATHOLOGIST SMEAR REVIEW

## 2021-12-27 LAB — COMPREHENSIVE METABOLIC PANEL
ALT: 30 U/L (ref 0–44)
AST: 50 U/L — ABNORMAL HIGH (ref 15–41)
Albumin: 3.3 g/dL — ABNORMAL LOW (ref 3.5–5.0)
Alkaline Phosphatase: 57 U/L (ref 38–126)
Anion gap: 10 (ref 5–15)
BUN: 11 mg/dL (ref 6–20)
CO2: 22 mmol/L (ref 22–32)
Calcium: 8.7 mg/dL — ABNORMAL LOW (ref 8.9–10.3)
Chloride: 102 mmol/L (ref 98–111)
Creatinine, Ser: 0.52 mg/dL (ref 0.44–1.00)
GFR, Estimated: >60 mL/min (ref >60)
Glucose, Bld: 96 mg/dL (ref 70–99)
Potassium: 3.4 mmol/L — ABNORMAL LOW (ref 3.5–5.1)
Sodium: 134 mmol/L — ABNORMAL LOW (ref 135–145)
Total Bilirubin: 1.1 mg/dL (ref 0.3–1.2)
Total Protein: 6.1 g/dL — ABNORMAL LOW (ref 6.5–8.1)

## 2021-12-27 LAB — CBC
HCT: 35.6 % — ABNORMAL LOW (ref 36.0–46.0)
Hemoglobin: 12.5 g/dL (ref 12.0–15.0)
MCH: 32.6 pg (ref 26.0–34.0)
MCHC: 35.1 g/dL (ref 30.0–36.0)
MCV: 93 fL (ref 80.0–100.0)
Platelets: 46 10*3/uL — ABNORMAL LOW (ref 150–400)
RBC: 3.83 MIL/uL — ABNORMAL LOW (ref 3.87–5.11)
RDW: 13.8 % (ref 11.5–15.5)
WBC: 2.8 10*3/uL — ABNORMAL LOW (ref 4.0–10.5)
nRBC: 0 % (ref 0.0–0.2)

## 2021-12-27 LAB — URINALYSIS, ROUTINE W REFLEX MICROSCOPIC
Bilirubin Urine: NEGATIVE
Glucose, UA: NEGATIVE mg/dL
Hgb urine dipstick: NEGATIVE
Ketones, ur: NEGATIVE mg/dL
Leukocytes,Ua: NEGATIVE
Nitrite: NEGATIVE
Protein, ur: NEGATIVE mg/dL
Specific Gravity, Urine: 1.008 (ref 1.005–1.030)
pH: 8 (ref 5.0–8.0)

## 2021-12-27 LAB — MAGNESIUM: Magnesium: 1.6 mg/dL — ABNORMAL LOW (ref 1.7–2.4)

## 2021-12-27 MED ORDER — CHLORDIAZEPOXIDE HCL 25 MG PO CAPS
25.0000 mg | ORAL_CAPSULE | Freq: Every day | ORAL | Status: AC
  Administered 2021-12-28: 25 mg via ORAL

## 2021-12-27 MED ORDER — CHLORDIAZEPOXIDE HCL 25 MG PO CAPS
25.0000 mg | ORAL_CAPSULE | Freq: Four times a day (QID) | ORAL | Status: AC
  Administered 2021-12-27 (×3): 25 mg via ORAL
  Filled 2021-12-27 (×3): qty 1

## 2021-12-27 MED ORDER — CHLORDIAZEPOXIDE HCL 25 MG PO CAPS
25.0000 mg | ORAL_CAPSULE | Freq: Three times a day (TID) | ORAL | Status: AC
  Administered 2021-12-28 (×2): 25 mg via ORAL
  Filled 2021-12-27 (×3): qty 1

## 2021-12-27 MED ORDER — CHLORDIAZEPOXIDE HCL 25 MG PO CAPS: 25.0000 mg | ORAL_CAPSULE | ORAL | Status: DC

## 2021-12-27 MED ORDER — AMPHETAMINE-DEXTROAMPHETAMINE 10 MG PO TABS
20.0000 mg | ORAL_TABLET | Freq: Two times a day (BID) | ORAL | Status: DC
  Administered 2021-12-27 – 2021-12-28 (×3): 20 mg via ORAL
  Filled 2021-12-27 (×3): qty 2

## 2021-12-27 MED ORDER — ONDANSETRON 4 MG PO TBDP: 4.0000 mg | ORAL_TABLET | Freq: Four times a day (QID) | ORAL | Status: DC | PRN

## 2021-12-27 MED ORDER — CHLORDIAZEPOXIDE HCL 25 MG PO CAPS: 25.0000 mg | ORAL_CAPSULE | Freq: Four times a day (QID) | ORAL | Status: DC | PRN

## 2021-12-27 MED ORDER — DROSPIRENONE-ETHINYL ESTRADIOL 3-0.02 MG PO TABS: 1.0000 | ORAL_TABLET | Freq: Every evening | ORAL | Status: DC

## 2021-12-27 MED ORDER — LOPERAMIDE HCL 2 MG PO CAPS: 2.0000 mg | ORAL_CAPSULE | ORAL | Status: DC | PRN

## 2021-12-27 MED ORDER — POTASSIUM CHLORIDE CRYS ER 20 MEQ PO TBCR
40.0000 meq | EXTENDED_RELEASE_TABLET | Freq: Once | ORAL | Status: AC
Start: 2021-12-27 — End: 2021-12-27
  Administered 2021-12-27: 40 meq via ORAL
  Filled 2021-12-27: qty 2

## 2021-12-27 MED ORDER — MAGNESIUM SULFATE 4 GM/100ML IV SOLN
4.0000 g | Freq: Once | INTRAVENOUS | Status: AC
  Administered 2021-12-27: 4 g via INTRAVENOUS
  Filled 2021-12-27: qty 100

## 2021-12-27 MED ORDER — LORAZEPAM 2 MG/ML IJ SOLN: 1.0000 mg | INTRAMUSCULAR | Status: DC | PRN

## 2021-12-27 MED ORDER — LORAZEPAM 1 MG PO TABS
1.0000 mg | ORAL_TABLET | ORAL | Status: DC | PRN
  Administered 2021-12-27 – 2021-12-28 (×5): 2 mg via ORAL
  Filled 2021-12-27 (×5): qty 2

## 2021-12-27 MED ORDER — THIAMINE HCL 100 MG PO TABS: 100.0000 mg | ORAL_TABLET | Freq: Every day | ORAL | Status: DC

## 2021-12-27 MED ORDER — HYDROXYZINE HCL 25 MG PO TABS
25.0000 mg | ORAL_TABLET | Freq: Four times a day (QID) | ORAL | Status: DC | PRN
  Administered 2021-12-28 (×3): 25 mg via ORAL
  Filled 2021-12-27 (×4): qty 1

## 2021-12-27 NOTE — Progress Notes (Signed)
PROGRESS NOTE    Taylor [name]  PTW:[id number] DOB: 12/24/1980 DOA: 12/25/2021 PCP: Barbie Banner, MD    Chief Complaint  Patient presents with   Altered Mental Status    Brief Narrative:  Patient 41 year old female with a history of anxiety/depression, alcohol abuse presented with tremors and agitation noted to be in alcohol withdrawal.  Patient stated prior history of alcohol withdrawal seizures.  Patient initially started on Librium but began vomiting and remained tremulous and/placed on the Ativan withdrawal protocol.  Patient admitted for further evaluation and management.   Assessment & Plan:   Principal Problem:   Alcohol withdrawal (HCC) Active Problems:   Alcohol use disorder, severe, in early remission, dependence (HCC)   Generalized anxiety disorder   Thrombocytopenia (HCC)   Neutropenia (HCC)   Hypokalemia   Hypomagnesemia  #1 alcohol withdrawal/alcohol abuse -Patient presented with alcohol withdrawal with tremors, agitation, noted to have just consumed alcohol on the day of admission. -EtOH level on admission was 488. -Patient motivated and interested in alcohol cessation. -Patient still with tremors noted on examination, nausea. -Placed on the Librium detox protocol. -Continue Ativan withdrawal protocol. -Continue thiamine, folic acid, multivitamin. -Patient states she has a bed at [facility] this Friday and was seen by TOC. -IV fluids, supportive care.  2.  Hypokalemia/hypomagnesemia -Likely secondary to problem #1. -Replete.  3.  Thrombocytopenia/neutropenia -Likely secondary to ongoing alcohol use. -Patient with no overt bleeding. -Smear pending. -Follow.  4.  Generalized anxiety disorder/depression -On Librium detox protocol. -Continue home regimen Wellbutrin.    DVT prophylaxis: SCDs Code Status: Full Family Communication: Updated patient.  No family at bedside. Disposition:   Status is: Inpatient  Remains inpatient appropriate  because: Severity of illness       Consultants:  None  Procedures:  None  Antimicrobials:  None   Subjective: Patient sleeping but arousable.  Still with tremors, some nausea with dry heaves.  No chest pain.  No shortness of breath.  Interested in alcohol rehabilitation.  Patient states already has a bed at [facility] this Friday.  Objective: Vitals:   12/26/21 1423 12/26/21 2311 12/27/21 0253 12/27/21 1131  BP: (!) 153/93 (!) 158/105 (!) 149/102 (!) 135/97  Pulse: (!) 101 92 84 97  Resp: 18 16 18 18   Temp: 98.8 F (37.1 C) 100 F (37.8 C) 99 F (37.2 C) 98.4 F (36.9 C)  TempSrc: Oral Oral Oral   SpO2: 99% 99% 100% 99%  Weight:      Height:        Intake/Output Summary (Last 24 hours) at 12/27/2021 1703 Last data filed at 12/27/2021 1501 Gross per 24 hour  Intake 2184.62 ml  Output --  Net 2184.62 ml   Filed Weights   12/25/21 2027  Weight: 59 kg    Examination:  General exam: Tremors noted. Respiratory system: Clear to auscultation bilaterally, no wheezes, no crackles, no rhonchi.2028 Respiratory effort normal. Cardiovascular system: S1 & S2 heard, RRR. No JVD, murmurs, rubs, gallops or clicks. No pedal edema. Gastrointestinal system: Abdomen is nondistended, soft and nontender. No organomegaly or masses felt. Normal bowel sounds heard. Central nervous system: Alert and oriented. No focal neurological deficits. Extremities: Symmetric 5 x 5 power. Skin: No rashes, lesions or ulcers Psychiatry: Judgement and insight appear normal. Mood & affect appropriate.     Data Reviewed: I have personally reviewed following labs and imaging studies  CBC: Recent Labs  Lab 12/25/21 2101 12/27/21 0351  WBC 4.2 2.8*  NEUTROABS 2.0  --  HGB 16.0* 12.5  HCT 46.1* 35.6*  MCV 92.2 93.0  PLT 83* 46*    Basic Metabolic Panel: Recent Labs  Lab 12/25/21 2101 12/26/21 0923 12/27/21 0351  NA 139  --  134*  K 3.6  --  3.4*  CL 104  --  102  CO2 19*  --  22   GLUCOSE 106*  --  96  BUN 13  --  11  CREATININE 0.62  --  0.52  CALCIUM 7.9*  --  8.7*  MG  --  1.8 1.6*    GFR: Estimated Creatinine Clearance: 73.9 mL/min (by C-G formula based on SCr of 0.52 mg/dL).  Liver Function Tests: Recent Labs  Lab 12/25/21 2101 12/27/21 0351  AST 49* 50*  ALT 35 30  ALKPHOS 66 57  BILITOT 0.5 1.1  PROT 7.3 6.1*  ALBUMIN 3.9 3.3*    CBG: No results for input(s): GLUCAP in the last 168 hours.   Recent Results (from the past 240 hour(s))  Resp Panel by RT-PCR (Flu A&B, Covid) Urine, Clean Catch     Status: None   Collection Time: 12/25/21  9:01 PM   Specimen: Urine, Clean Catch; Nasopharyngeal(NP) swabs in vial transport medium  Result Value Ref Range Status   SARS Coronavirus 2 by RT PCR NEGATIVE NEGATIVE Final    Comment: (NOTE) SARS-CoV-2 target nucleic acids are NOT DETECTED.  The SARS-CoV-2 RNA is generally detectable in upper respiratory specimens during the acute phase of infection. The lowest concentration of SARS-CoV-2 viral copies this assay can detect is 138 copies/mL. A negative result does not preclude SARS-Cov-2 infection and should not be used as the sole basis for treatment or other patient management decisions. A negative result may occur with  improper specimen collection/handling, submission of specimen other than nasopharyngeal swab, presence of viral mutation(s) within the areas targeted by this assay, and inadequate number of viral copies(<138 copies/mL). A negative result must be combined with clinical observations, patient history, and epidemiological information. The expected result is Negative.  Fact Sheet for Patients:  BloggerCourse.com  Fact Sheet for Healthcare Providers:  SeriousBroker.it  This test is no t yet approved or cleared by the [place] FDA and  has been authorized for detection and/or diagnosis of SARS-CoV-2 by FDA under an Emergency Use  Authorization (EUA). This EUA will remain  in effect (meaning this test can be used) for the duration of the COVID-19 declaration under Section 564(b)(1) of the Act, 21 U.S.C.section 360bbb-3(b)(1), unless the authorization is terminated  or revoked sooner.       Influenza A by PCR NEGATIVE NEGATIVE Final   Influenza B by PCR NEGATIVE NEGATIVE Final    Comment: (NOTE) The Xpert Xpress SARS-CoV-2/FLU/RSV plus assay is intended as an aid in the diagnosis of influenza from Nasopharyngeal swab specimens and should not be used as a sole basis for treatment. Nasal washings and aspirates are unacceptable for Xpert Xpress SARS-CoV-2/FLU/RSV testing.  Fact Sheet for Patients: BloggerCourse.com  Fact Sheet for Healthcare Providers: SeriousBroker.it  This test is not yet approved or cleared by the [place] FDA and has been authorized for detection and/or diagnosis of SARS-CoV-2 by FDA under an Emergency Use Authorization (EUA). This EUA will remain in effect (meaning this test can be used) for the duration of the COVID-19 declaration under Section 564(b)(1) of the Act, 21 U.S.C. section 360bbb-3(b)(1), unless the authorization is terminated or revoked.  Performed at [facility], [address] [place], [place] [address]  Radiology Studies: No results found.      Scheduled Meds:  buPROPion  300 mg Oral QPC breakfast   chlordiazePOXIDE  25 mg Oral QID   Followed by   Melene Muller[START ON 12/28/2021] chlordiazePOXIDE  25 mg Oral TID   Followed by   Melene Muller[START ON 12/29/2021] chlordiazePOXIDE  25 mg Oral BH-qamhs   Followed by   Melene Muller[START ON 12/30/2021] chlordiazePOXIDE  25 mg Oral Daily   pantoprazole  40 mg Oral Daily   thiamine  100 mg Oral Daily   Or   thiamine  100 mg Intravenous Daily   Continuous Infusions:  sodium chloride Stopped (12/27/21 1409)     LOS: 1 day    Time spent: 35  minutes    Ramiro Harvestaniel Coleby Yett, MD [facility]   To contact the attending provider between 7A-7P or the covering provider during after hours 7P-7A, please log into the web site [email or web address] and access using universal [place] password for that web site. If you do not have the password, please call the hospital operator.  12/27/2021, 5:03 PM

## 2021-12-27 NOTE — TOC Progression Note (Signed)
Transition of Care [facility]) - Progression Note    Patient Details  Name: Taylor [name] MRN: [id number] Date of Birth: Jan 23, 1981  Transition of Care [facility]) CM/SW Contact  Geni Bers, RN Phone Number: 12/27/2021, 2:10 PM  Clinical Narrative:     TOC reviewed pt's chart. From home with spouse.   Expected Discharge Plan: Home/Self Care Barriers to Discharge: No Barriers Identified  Expected Discharge Plan and Services Expected Discharge Plan: Home/Self Care       Living arrangements for the past 2 months: Single Family Home                                       Social Determinants of Health (SDOH) Interventions    Readmission Risk Interventions No flowsheet data found.

## 2021-12-28 ENCOUNTER — Inpatient Hospital Stay (HOSPITAL_COMMUNITY): Payer: BC Managed Care – PPO

## 2021-12-28 ENCOUNTER — Encounter (HOSPITAL_COMMUNITY): Payer: Self-pay | Admitting: Internal Medicine

## 2021-12-28 DIAGNOSIS — D696 Thrombocytopenia, unspecified: Secondary | ICD-10-CM | POA: Diagnosis not present

## 2021-12-28 DIAGNOSIS — F10939 Alcohol use, unspecified with withdrawal, unspecified: Secondary | ICD-10-CM

## 2021-12-28 LAB — COMPREHENSIVE METABOLIC PANEL
ALT: 33 U/L (ref 0–44)
AST: 45 U/L — ABNORMAL HIGH (ref 15–41)
Albumin: 3.5 g/dL (ref 3.5–5.0)
Alkaline Phosphatase: 61 U/L (ref 38–126)
Anion gap: 10 (ref 5–15)
BUN: 5 mg/dL — ABNORMAL LOW (ref 6–20)
CO2: 19 mmol/L — ABNORMAL LOW (ref 22–32)
Calcium: 8.3 mg/dL — ABNORMAL LOW (ref 8.9–10.3)
Chloride: 108 mmol/L (ref 98–111)
Creatinine, Ser: 0.55 mg/dL (ref 0.44–1.00)
GFR, Estimated: >60 mL/min (ref >60)
Glucose, Bld: 84 mg/dL (ref 70–99)
Potassium: 3.6 mmol/L (ref 3.5–5.1)
Sodium: 137 mmol/L (ref 135–145)
Total Bilirubin: 0.5 mg/dL (ref 0.3–1.2)
Total Protein: 6.4 g/dL — ABNORMAL LOW (ref 6.5–8.1)

## 2021-12-28 LAB — CBC WITH DIFFERENTIAL/PLATELET
Abs Immature Granulocytes: 0.01 10*3/uL (ref 0.00–0.07)
Basophils Absolute: 0 10*3/uL (ref 0.0–0.1)
Basophils Relative: 1 %
Eosinophils Absolute: 0.1 10*3/uL (ref 0.0–0.5)
Eosinophils Relative: 3 %
HCT: 35.5 % — ABNORMAL LOW (ref 36.0–46.0)
Hemoglobin: 12.2 g/dL (ref 12.0–15.0)
Immature Granulocytes: 0 %
Lymphocytes Relative: 27 %
Lymphs Abs: 0.7 10*3/uL (ref 0.7–4.0)
MCH: 32.7 pg (ref 26.0–34.0)
MCHC: 34.4 g/dL (ref 30.0–36.0)
MCV: 95.2 fL (ref 80.0–100.0)
Monocytes Absolute: 0.4 10*3/uL (ref 0.1–1.0)
Monocytes Relative: 13 %
Neutro Abs: 1.5 10*3/uL — ABNORMAL LOW (ref 1.7–7.7)
Neutrophils Relative %: 56 %
Platelets: 38 10*3/uL — ABNORMAL LOW (ref 150–400)
RBC: 3.73 MIL/uL — ABNORMAL LOW (ref 3.87–5.11)
RDW: 14 % (ref 11.5–15.5)
WBC: 2.7 10*3/uL — ABNORMAL LOW (ref 4.0–10.5)
nRBC: 0 % (ref 0.0–0.2)

## 2021-12-28 LAB — GLUCOSE, CAPILLARY: Glucose-Capillary: 81 mg/dL (ref 70–99)

## 2021-12-28 LAB — PHOSPHORUS: Phosphorus: 2.9 mg/dL (ref 2.5–4.6)

## 2021-12-28 LAB — LACTATE DEHYDROGENASE: LDH: 196 U/L — ABNORMAL HIGH (ref 98–192)

## 2021-12-28 LAB — PROTIME-INR
INR: 1 (ref 0.8–1.2)
Prothrombin Time: 13.5 seconds (ref 11.4–15.2)

## 2021-12-28 LAB — SAVE SMEAR(SSMR), FOR PROVIDER SLIDE REVIEW

## 2021-12-28 LAB — MAGNESIUM: Magnesium: 2 mg/dL (ref 1.7–2.4)

## 2021-12-28 MED ORDER — SODIUM CHLORIDE (PF) 0.9 % IJ SOLN
INTRAMUSCULAR | Status: AC
  Filled 2021-12-28: qty 50

## 2021-12-28 MED ORDER — NICOTINE 14 MG/24HR TD PT24
14.0000 mg | MEDICATED_PATCH | Freq: Every day | TRANSDERMAL | Status: DC
  Administered 2021-12-28: 14 mg via TRANSDERMAL
  Filled 2021-12-28 (×2): qty 1

## 2021-12-28 MED ORDER — IOHEXOL 9 MG/ML PO SOLN
ORAL | Status: AC
  Filled 2021-12-28: qty 1000

## 2021-12-28 MED ORDER — IOHEXOL 9 MG/ML PO SOLN
500.0000 mL | ORAL | Status: AC
  Administered 2021-12-28 (×2): 500 mL via ORAL

## 2021-12-28 MED ORDER — POTASSIUM CHLORIDE CRYS ER 20 MEQ PO TBCR
40.0000 meq | EXTENDED_RELEASE_TABLET | Freq: Once | ORAL | Status: AC
Start: 2021-12-28 — End: 2021-12-28
  Administered 2021-12-28: 40 meq via ORAL
  Filled 2021-12-28: qty 2

## 2021-12-28 MED ORDER — IOHEXOL 350 MG/ML SOLN
80.0000 mL | Freq: Once | INTRAVENOUS | Status: AC | PRN
  Administered 2021-12-28: 80 mL via INTRAVENOUS

## 2021-12-28 NOTE — Progress Notes (Addendum)
PROGRESS NOTE    Taylor [name]  SLH:[id number] DOB: 1981-08-24 DOA: 12/25/2021 PCP: Barbie Banner, MD    Chief Complaint  Patient presents with   Altered Mental Status    Brief Narrative:  Patient 41 year old female with a history of anxiety/depression, alcohol abuse presented with tremors and agitation noted to be in alcohol withdrawal.  Patient stated prior history of alcohol withdrawal seizures.  Patient initially started on Librium but began vomiting and remained tremulous and/placed on the Ativan withdrawal protocol.  Patient admitted for further evaluation and management.   Assessment & Plan:   Principal Problem:   Alcohol withdrawal (HCC) Active Problems:   Alcohol use disorder, severe, in early remission, dependence (HCC)   Generalized anxiety disorder   Thrombocytopenia (HCC)   Neutropenia (HCC)   Hypokalemia   Hypomagnesemia  #1 alcohol withdrawal/alcohol abuse -Patient presented with alcohol withdrawal with tremors, agitation, noted to have just consumed alcohol on the day of admission. -EtOH level on admission was 488. -Patient motivated and interested in alcohol cessation. -Patient with improvement with withdrawal symptoms of tremors.   -Continue Librium detox protocol, Ativan withdrawal protocol, thiamine, folic acid, multivitamin.   -Patient states she has a bed at [facility] this Friday and was seen by TOC. -Saline lock IV fluids.  Supportive care.   2.  Hypokalemia/hypomagnesemia -Likely secondary to problem #1. -Potassium at 3.6, magnesium at 2.0.  3.  Thrombocytopenia/neutropenia -Likely secondary to ongoing alcohol use. -Patient with no overt bleeding. -Same smear. -LDH noted to be elevated at 196. -Haptoglobin pending. -Save smear. -CT abdomen and pelvis negative for obstruction or pneumoperitoneum, no hydronephrosis, normal appendix.  Fatty liver noted with minimal nodularity in the liver surface which may be normal variant or suggest  possible early cirrhosis.  No splenomegaly.  No portosystemic shunt noted.  2.1 x 0.5 cm linear low-attenuation in the liver, 2.4 cm area of low-attenuation in the liver close to the porta hepatis.  Findings may be benign process or cyst or hemangiomas.  Possible left renal cyst. -Hematology consulted. -Follow-up.  4.  Generalized anxiety disorder/depression -Continue Librium detox protocol, home regimen Wellbutrin.  5.  Tobacco abuse -Tobacco cessation stressed to patient. -Nicotine patch.    DVT prophylaxis: SCDs Code Status: Full Family Communication: Updated patient.  No family at bedside. Disposition:   Status is: Inpatient  Remains inpatient appropriate because: Severity of illness       Consultants:  Hematology pending.  Procedures:  CT abdomen and pelvis 11/27/2022  Antimicrobials:  None   Subjective: Patient sitting up in bed on the phone.  States clinically she is improving from her withdrawal symptoms with improvement with tremors.  Patient emotional wanting to see her son and family.  No chest pain.  No shortness of breath.  No abdominal pain.  Tolerating current diet.  No bleeding noted.   Objective: Vitals:   12/27/21 2014 12/27/21 2029 12/27/21 2211 12/28/21 0642  BP: (!) 144/114 (!) 142/97 (!) 141/89 (!) 147/89  Pulse: (!) 108 (!) 106 (!) 106 (!) 104  Resp: 20 16  16   Temp: 98.9 F (37.2 C)   98.3 F (36.8 C)  TempSrc: Oral   Oral  SpO2: 100%   99%  Weight:      Height:        Intake/Output Summary (Last 24 hours) at 12/28/2021 1338 Last data filed at 12/28/2021 1001 Gross per 24 hour  Intake 2042.78 ml  Output --  Net 2042.78 ml    02/25/2022  12/25/21 2027  Weight: 59 kg    Examination:  General exam: : Tremors improved. Respiratory system: CTA B.  No wheezes, no rhonchi.  Speaking in full sentences.  Normal respiratory effort. Cardiovascular system: Regular rate and rhythm no murmurs rubs or gallops.  No JVD.  No lower  extremity edema.  Gastrointestinal system: Abdomen soft, nontender, nondistended, positive bowel sounds.  No rebound.  No guarding. Central nervous system: Alert and oriented. No focal neurological deficits. Extremities: Symmetric 5 x 5 power. Skin: No rashes, lesions or ulcers Psychiatry: Judgement and insight appear normal. Mood & affect appropriate.    Data Reviewed: I have personally reviewed following labs and imaging studies  CBC: Recent Labs  Lab 12/25/21 2101 12/27/21 0351 12/28/21 0336  WBC 4.2 2.8* 2.7*  NEUTROABS 2.0  --  1.5*  HGB 16.0* 12.5 12.2  HCT 46.1* 35.6* 35.5*  MCV 92.2 93.0 95.2  PLT 83* 46* 38*     Basic Metabolic Panel: Recent Labs  Lab 12/25/21 2101 12/26/21 0923 12/27/21 0351 12/28/21 0336  NA 139  --  134* 137  K 3.6  --  3.4* 3.6  CL 104  --  102 108  CO2 19*  --  22 19*  GLUCOSE 106*  --  96 84  BUN 13  --  11 5*  CREATININE 0.62  --  0.52 0.55  CALCIUM 7.9*  --  8.7* 8.3*  MG  --  1.8 1.6* 2.0  PHOS  --   --   --  2.9     GFR: Estimated Creatinine Clearance: 73.9 mL/min (by C-G formula based on SCr of 0.55 mg/dL).  Liver Function Tests: Recent Labs  Lab 12/25/21 2101 12/27/21 0351 12/28/21 0336  AST 49* 50* 45*  ALT 35 30 33  ALKPHOS 66 57 61  BILITOT 0.5 1.1 0.5  PROT 7.3 6.1* 6.4*  ALBUMIN 3.9 3.3* 3.5     CBG: Recent Labs  Lab 12/28/21 0714  GLUCAP 81     Recent Results (from the past 240 hour(s))  Resp Panel by RT-PCR (Flu A&B, Covid) Urine, Clean Catch     Status: None   Collection Time: 12/25/21  9:01 PM   Specimen: Urine, Clean Catch; Nasopharyngeal(NP) swabs in vial transport medium  Result Value Ref Range Status   SARS Coronavirus 2 by RT PCR NEGATIVE NEGATIVE Final    Comment: (NOTE) SARS-CoV-2 target nucleic acids are NOT DETECTED.  The SARS-CoV-2 RNA is generally detectable in upper respiratory specimens during the acute phase of infection. The lowest concentration of SARS-CoV-2 viral copies  this assay can detect is 138 copies/mL. A negative result does not preclude SARS-Cov-2 infection and should not be used as the sole basis for treatment or other patient management decisions. A negative result may occur with  improper specimen collection/handling, submission of specimen other than nasopharyngeal swab, presence of viral mutation(s) within the areas targeted by this assay, and inadequate number of viral copies(<138 copies/mL). A negative result must be combined with clinical observations, patient history, and epidemiological information. The expected result is Negative.  Fact Sheet for Patients:  BloggerCourse.comhttps://www.fda.gov/media/152166/download  Fact Sheet for Healthcare Providers:  SeriousBroker.ithttps://www.fda.gov/media/152162/download  This test is no t yet approved or cleared by the [place] FDA and  has been authorized for detection and/or diagnosis of SARS-CoV-2 by FDA under an Emergency Use Authorization (EUA). This EUA will remain  in effect (meaning this test can be used) for the duration of the COVID-19 declaration under Section 564(b)(1) of  the Act, 21 U.S.C.section 360bbb-3(b)(1), unless the authorization is terminated  or revoked sooner.       Influenza A by PCR NEGATIVE NEGATIVE Final   Influenza B by PCR NEGATIVE NEGATIVE Final    Comment: (NOTE) The Xpert Xpress SARS-CoV-2/FLU/RSV plus assay is intended as an aid in the diagnosis of influenza from Nasopharyngeal swab specimens and should not be used as a sole basis for treatment. Nasal washings and aspirates are unacceptable for Xpert Xpress SARS-CoV-2/FLU/RSV testing.  Fact Sheet for Patients: BloggerCourse.comhttps://www.fda.gov/media/152166/download  Fact Sheet for Healthcare Providers: SeriousBroker.ithttps://www.fda.gov/media/152162/download  This test is not yet approved or cleared by the [place] FDA and has been authorized for detection and/or diagnosis of SARS-CoV-2 by FDA under an Emergency Use Authorization (EUA). This EUA  will remain in effect (meaning this test can be used) for the duration of the COVID-19 declaration under Section 564(b)(1) of the Act, 21 U.S.C. section 360bbb-3(b)(1), unless the authorization is terminated or revoked.  Performed at [facility], [address] [place], [place] [address]           Radiology Studies: CT ABDOMEN PELVIS W CONTRAST  Result Date: 12/28/2021 CLINICAL DATA:  Portal hypertension, thrombocytopenia EXAM: CT ABDOMEN AND PELVIS WITH CONTRAST TECHNIQUE: Multidetector CT imaging of the abdomen and pelvis was performed using the standard protocol following bolus administration of intravenous contrast. CONTRAST:  80mL OMNIPAQUE IOHEXOL 350 MG/ML SOLN COMPARISON:  02/05/2019 FINDINGS: Lower chest: Unremarkable. Hepatobiliary: Liver measures 14.9 cm in length. There is fatty infiltration. There is minimal nodularity in the liver surface. In image 32 of series 2, there is a linear low-density lesion in the liver measuring 2.1 x 0.5 cm. In image 26, there is 2.4 cm low-density close to the porta hepatis. These findings were not evident in the previous examination. Gallbladder is distended. There is no wall thickening in gallbladder. There is no dilation of bile ducts. Pancreas: No focal abnormality is seen. Spleen: Spleen is not enlarged. Adrenals/Urinary Tract: Adrenals are unremarkable. There is no hydronephrosis. There are no renal or ureteral stones. Urinary bladder is unremarkable. There is 12 mm low-density lesion in the midportion of left kidney, possibly a cyst. Stomach/Bowel: Stomach is not distended. Small bowel loops are not dilated. Appendix is of normal caliber. There is no significant wall thickening in colon. There is no pericolic stranding. Vascular/Lymphatic: Unremarkable. Reproductive: Unremarkable. Other: There is no ascites or pneumoperitoneum. Small umbilical hernia containing fat is seen. Musculoskeletal: Unremarkable. IMPRESSION: There is  no evidence intestinal obstruction or pneumoperitoneum. There is no hydronephrosis. Appendix is not dilated. Fatty liver. There is minimal nodularity in the liver surface which may be normal variation or suggest possible early cirrhosis. Spleen is not enlarged. No definite signs portosystemic shunt are noted. There is 2.1 x 0.5 cm linear low-attenuation in the liver. There is another 2.4 cm area of low attenuation in the liver close to porta hepatis. Findings may suggest benign process such as cysts or hemangiomas or some other neoplastic process. These findings were not seen on 02/05/2019. Short-term follow-up multiphasic CT or MRI may be considered. Possible left renal cyst. Other findings as described in the body of the report. Electronically Signed   By: Ernie AvenaPalani  Rathinasamy M.D.   On: 12/28/2021 12:59        Scheduled Meds:  amphetamine-dextroamphetamine  20 mg Oral BID WC   buPROPion  300 mg Oral QPC breakfast   chlordiazePOXIDE  25 mg Oral TID   Followed by   Melene Muller[START ON 12/29/2021]  chlordiazePOXIDE  25 mg Oral BH-qamhs   drospirenone-ethinyl estradiol  1 tablet Oral QPM   iohexol       pantoprazole  40 mg Oral Daily   thiamine  100 mg Oral Daily   Or   thiamine  100 mg Intravenous Daily   Continuous Infusions:  sodium chloride 75 mL/hr at 12/28/21 1032     LOS: 2 days    Time spent: 35 minutes    Ramiro Harvest, MD [facility]   To contact the attending provider between 7A-7P or the covering provider during after hours 7P-7A, please log into the web site [email or web address] and access using universal [place] password for that web site. If you do not have the password, please call the hospital operator.  12/28/2021, 1:38 PM

## 2021-12-28 NOTE — Consult Note (Addendum)
[facility]  Telephone:(336) [phone] Fax:(336) [phone]    INITIAL HEMATOLOGY CONSULTATION  Referring MD:  Dr. Ramiro Harvestaniel Thompson  Reason for Referral: Leukopenia and thrombocytopenia  HPI: Taylor [name] is a 41 year old female with a past medical history significant for anxiety, depression, alcohol abuse.  She presented to the emergency department with tremors.  She indicates that she is trying to quit alcohol.  She had a drink the day prior to admission and then felt tremulous and agitated since that time.  She indicated that she knew that she was going into withdrawal so came to the emergency department for help.  She was started on Librium but developed vomiting.  She has been started on CIWA.  Admission lab work showed a WBC of 4.2, hemoglobin 16.0, platelet count 83,000, calcium 7.9, AST 49.  Ethanol level was 488 on admission.  Pathologist review of peripheral blood smear on 12/26/2021 indicates thrombocytopenia.  CBC performed 12/27/2021 showed a drop in her WBC down to 2.8 and drop in platelets down to 46,000.  Today, her WBC is 2.7,  platelets are 38,000 and her ANC is 1.5.  LDH this morning is mildly elevated at 196.  CBC results dating back to August 2018 showing normal platelet count.  First abnormal platelet count was on 10/23/2021 when platelet count was 82,000.  Her WBC and ANC have been persistently normal dating back to August 2018.  Patient reports that she was drinking very heavily for several days prior to this admission.  Prior to that, she had been abstinent from alcohol for about 90 days.  She states that she has had issues with alcohol abuse dating back for at least 5 years.  He has been to rehab on several occasions.  She reports that she had tremors prior to admission but no seizure activity.  However, she has had seizures in the past related to alcohol withdrawal.  She currently denies headaches, dizziness, chest pain, shortness of breath, abdominal pain, nausea,  vomiting.  No bleeding reported.  The patient is single and has 1 son.  Alcohol abuse as noted above.  She reports that she smokes cigarettes.  Denies family history of bleeding disorder and hematologic malignancy.  Hematology was asked to see the patient to make recommendations regarding her leukopenia and thrombocytopenia.   Past Medical History:  Diagnosis Date   Anemia    Anxiety    Depression    welbutrin - stopped taking it early pregn but will be back on it after  :  Past Surgical History:  Procedure Laterality Date   KNEE SURGERY     right knee surgery 4 times - soccer  :  CURRENT MEDS: Current Facility-Administered Medications  Medication Dose Route Frequency Provider Last Rate Last Admin   0.9 %  sodium chloride infusion   Intravenous Continuous Rodolph Bonghompson, Daniel V, MD 125 mL/hr at 12/28/21 0731 Infusion Verify at 12/28/21 0731   acetaminophen (TYLENOL) tablet 650 mg  650 mg Oral Q4H PRN Margie EgeKyle, Tyrone A, DO   650 mg at 12/28/21 0148   alum & mag hydroxide-simeth (MAALOX/MYLANTA) 200-200-20 MG/5ML suspension 30 mL  30 mL Oral Q6H PRN Ronaldo MiyamotoKyle, Tyrone A, DO       amphetamine-dextroamphetamine (ADDERALL) tablet 20 mg  20 mg Oral BID WC Rodolph Bonghompson, Daniel V, MD   20 mg at 12/28/21 0729   buPROPion (WELLBUTRIN XL) 24 hr tablet 300 mg  300 mg Oral QPC breakfast Ronaldo MiyamotoKyle, Tyrone A, DO   300 mg at 12/28/21 [phone]  chlordiazePOXIDE (LIBRIUM) capsule 25 mg  25 mg Oral Q6H PRN Rodolph Bong, MD       chlordiazePOXIDE (LIBRIUM) capsule 25 mg  25 mg Oral TID Rodolph Bong, MD       Followed by   Melene Muller ON 12/29/2021] chlordiazePOXIDE (LIBRIUM) capsule 25 mg  25 mg Oral Roe Coombs, MD       Followed by   Melene Muller ON 12/30/2021] chlordiazePOXIDE (LIBRIUM) capsule 25 mg  25 mg Oral Daily Rodolph Bong, MD       drospirenone-ethinyl estradiol (YAZ) 3-0.02 MG per tablet 1 tablet  1 tablet Oral QPM Rodolph Bong, MD       hydrOXYzine (ATARAX) tablet 25 mg  25 mg Oral Q6H PRN  Rodolph Bong, MD   25 mg at 12/28/21 0136   iohexol (OMNIPAQUE) 9 MG/ML oral solution 500 mL  500 mL Oral Q1H Rodolph Bong, MD       iohexol (OMNIPAQUE) 9 MG/ML oral solution            loperamide (IMODIUM) capsule 2-4 mg  2-4 mg Oral PRN Rodolph Bong, MD       LORazepam (ATIVAN) tablet 1-4 mg  1-4 mg Oral Q1H PRN Rodolph Bong, MD   2 mg at 12/28/21 8657   Or   LORazepam (ATIVAN) injection 1-4 mg  1-4 mg Intravenous Q1H PRN Rodolph Bong, MD       ondansetron (ZOFRAN-ODT) disintegrating tablet 4 mg  4 mg Oral Q6H PRN Rodolph Bong, MD       pantoprazole (PROTONIX) EC tablet 40 mg  40 mg Oral Daily Kyle, Tyrone A, DO   40 mg at 12/27/21 0908   potassium chloride SA (KLOR-CON M) CR tablet 40 mEq  40 mEq Oral Once Rodolph Bong, MD       prochlorperazine (COMPAZINE) injection 10 mg  10 mg Intravenous Q6H PRN Ronaldo Miyamoto, Tyrone A, DO       thiamine tablet 100 mg  100 mg Oral Daily Kyle, Tyrone A, DO   100 mg at 12/27/21 8469   Or   thiamine (B-1) injection 100 mg  100 mg Intravenous Daily Ronaldo Miyamoto, Tyrone A, DO   100 mg at 12/26/21 6295   zolpidem (AMBIEN) tablet 5 mg  5 mg Oral QHS PRN Margie Ege A, DO   5 mg at 12/27/21 2210    Allergies  Allergen Reactions   Bee Venom Anaphylaxis and Other (See Comments)    Allergic to Bee stings   : Family History  Problem Relation Age of Onset   Alcohol abuse Mother    Drug abuse Mother    Hypertension Mother    Heart disease Mother    Miscarriages / [place] Mother    Arthritis Father    Asthma Father    Depression Father    Hypertension Father    Varicose Veins Father    Asthma Sister    Learning disabilities Sister    Alcohol abuse Brother    Depression Brother    Drug abuse Brother    Hypertension Brother    Learning disabilities Brother    Mental illness Brother    Alcohol abuse Maternal Aunt    Depression Maternal Aunt    Drug abuse Maternal Aunt    Mental illness Maternal Aunt    Alcohol abuse  Maternal Uncle    Depression Paternal Aunt    Depression Paternal Uncle    Depression Maternal Grandmother  Heart disease Maternal Grandmother    Alcohol abuse Maternal Grandfather    Depression Maternal Grandfather    Depression Paternal Grandmother    Heart disease Paternal Grandmother    Depression Paternal Grandfather   : Social History   Socioeconomic History   Marital status: Single    Spouse name: Not on file   Number of children: Not on file   Years of education: Not on file   Highest education level: Not on file  Occupational History   Not on file  Tobacco Use   Smoking status: Some Days    Packs/day: 1.50    Years: 4.00    Pack years: 6.00    Types: Cigarettes   Smokeless tobacco: Never   Tobacco comments:    cutting back   Vaping Use   Vaping Use: Some days  Substance and Sexual Activity   Alcohol use: Yes    Alcohol/week: 6.0 standard drinks    Types: 6 Standard drinks or equivalent per week    Comment: past 5 years vodka and beer   Drug use: No   Sexual activity: Yes    Birth control/protection: Pill  Other Topics Concern   Not on file  Social History Narrative   Not on file   Social Determinants of Health   Financial Resource Strain: Not on file  Food Insecurity: Not on file  Transportation Needs: Not on file  Physical Activity: Not on file  Stress: Not on file  Social Connections: Not on file  Intimate Partner Violence: Not on file  :  REVIEW OF SYSTEMS:  A comprehensive 14 point review of systems was negative except as noted in the HPI.    Exam: Patient Vitals for the past 24 hrs:  BP Temp Temp src Pulse Resp SpO2  12/28/21 0642 (!) 147/89 98.3 F (36.8 C) Oral (!) 104 16 99 %  12/27/21 2211 (!) 141/89 -- -- (!) 106 -- --  12/27/21 2029 (!) 142/97 -- -- (!) 106 16 --  12/27/21 2014 (!) 144/114 98.9 F (37.2 C) Oral (!) 108 20 100 %  12/27/21 1131 (!) 135/97 98.4 F (36.9 C) -- 97 18 99 %    General:  well-nourished in no acute  distress.   Eyes:  no scleral icterus.   ENT:  There were no oropharyngeal lesions.     Lymphatics:  Negative cervical, supraclavicular or axillary adenopathy.   Respiratory: lungs were clear bilaterally without wheezing or crackles.   Cardiovascular:  Regular rate and rhythm, S1/S2, without murmur, rub or gallop.  There was no pedal edema.   GI:  abdomen was soft, flat, nontender, nondistended, without organomegaly.   Skin exam was without ecchymosis, petechiae.   Neuro exam was nonfocal.  Patient was alert and oriented.  Attention was good.   Language was appropriate.  Mood was normal without depression.  Speech was not pressured.  Thought content was not tangential.    LABS:  Lab Results  Component Value Date   WBC 2.7 (L) 12/28/2021   HGB 12.2 12/28/2021   HCT 35.5 (L) 12/28/2021   PLT 38 (L) 12/28/2021   GLUCOSE 84 12/28/2021   ALT 33 12/28/2021   AST 45 (H) 12/28/2021   NA 137 12/28/2021   K 3.6 12/28/2021   CL 108 12/28/2021   CREATININE 0.55 12/28/2021   BUN 5 (L) 12/28/2021   CO2 19 (L) 12/28/2021   Peripheral smear: The platelets are decreased in number.  The majority the white cells are  mature neutrophils and lymphocytes.  No blasts or other young forms are seen.  Few ovalocytes, target cells, and teardrops.  No nucleated red cells.  The polychromasia is not increased.   ASSESSMENT AND PLAN:  1.  Leukopenia 2.  Thrombocytopenia 3.  Alcohol abuse with alcohol withdrawal 4.  Anxiety 5.  Depression 6.  Tobacco dependence  Ms. Sazama has developed leukopenia and thrombocytopenia.  Her thrombocytopenia dates back to at least November 2022.  However, thrombocytopenia has slowly worsened this admission.  Leukopenia is new this admission.  Discussed lab findings with the patient.  Suspect findings are related to bone marrow suppression from alcohol use.  Could also have an underlying component of liver disease/cirrhosis from her alcohol use.  CT abdomen/pelvis has been  ordered.  Recommendations: 1.  We will review peripheral blood smear. 2.  We will follow-up on CT abdomen/pelvis. 3.  Recommend abstinence from alcohol.  The patient plans to go to rehab following his hospital admission. 4.  Follow CBC while off of alcohol  Thank you for this referral.  Clenton Pare, DNP, AGPCNP-BC, AOCNP  Ms. Aronoff was interviewed and examined.  She was admitted with alcohol withdrawal symptoms.  She reports heavy alcohol over 4-5 days prior to hospital admission, including on the day of admission.  She is noted to have progressive leukopenia and thrombocytopenia since hospital admission.  I suspect the cytopenias are related to acute bone marrow toxicity from heavy alcohol consumption.  There may have been a component of dehydration on hospital admission.  She may have chronic liver disease, but this is less likely.  I have a low clinical suspicion for a primary hematologic disorder such as ITP, or a lymphoproliferative disorder.  I encouraged her to discontinue alcohol use.  I was present for greater than 50% of today's visit.  I performed medical decision making.

## 2021-12-28 NOTE — Progress Notes (Signed)
° ° ° °                                               Against Medical Advice  Patient at this time expresses desire to leave the Hospital immidiately, patient has been warned that this is not Medically advisable at this time, and can result in Medical complications like Death and Disability, patient understands and accepts the risks involved and assumes full responsibilty of this decision.  This patient has also been advised that if they feel the need for further medical assistance to return to any available ER or dial [phone].  Informed by Nursing staff that this patient has left care and has signed the form  Against Medical Advice on 12/28/2021 at 2335 Hrs.  Chinita Greenland BSN MSNA MSN ACNPC-AG Acute Care Nurse Practitioner [facility]

## 2021-12-28 NOTE — Progress Notes (Signed)
Pt leaving AMA. Pt educated about risks and concerns and still addiment on leaving. Pt states that her son is "having an anaphylactic emergency" in the care of her brother and she needs to get there to assist. Pt signed AMA paperwork, and left with it, which was witnessed by Sumner Boast, RN. Writer and Sumner Boast then dual signed AMA paper and placed in pt chart. Daniels,NP was notified.

## 2021-12-28 NOTE — Plan of Care (Signed)
  Problem: Education: Goal: Knowledge of General Education information will improve Description Including pain rating scale, medication(s)/side effects and non-pharmacologic comfort measures Outcome: Progressing   Problem: Health Behavior/Discharge Planning: Goal: Ability to manage health-related needs will improve Outcome: Progressing   Problem: Clinical Measurements: Goal: Ability to maintain clinical measurements within normal limits will improve Outcome: Progressing Goal: Will remain free from infection Outcome: Progressing Goal: Diagnostic test results will improve Outcome: Progressing Goal: Respiratory complications will improve Outcome: Progressing Goal: Cardiovascular complication will be avoided Outcome: Progressing   Problem: Activity: Goal: Risk for activity intolerance will decrease Outcome: Progressing   Problem: Elimination: Goal: Will not experience complications related to bowel motility Outcome: Progressing Goal: Will not experience complications related to urinary retention Outcome: Progressing   Problem: Skin Integrity: Goal: Risk for impaired skin integrity will decrease Outcome: Progressing   

## 2021-12-29 ENCOUNTER — Other Ambulatory Visit: Payer: Self-pay | Admitting: *Deleted

## 2021-12-29 ENCOUNTER — Encounter: Payer: Self-pay | Admitting: *Deleted

## 2021-12-29 DIAGNOSIS — D696 Thrombocytopenia, unspecified: Secondary | ICD-10-CM

## 2021-12-29 LAB — HAPTOGLOBIN: Haptoglobin: 216 mg/dL (ref 33–278)

## 2021-12-29 NOTE — Progress Notes (Signed)
Lab orders entered for CBC D

## 2021-12-29 NOTE — Progress Notes (Signed)
PATIENT NAVIGATOR PROGRESS NOTE  Name: Taylor [name] Date: 12/29/2021 MRN: [id number]  DOB: 23-Jun-1981   Reason for visit:  Dr Truett Perna consulted in hospital  Comments:  Contacted patient's mother for F/U from pt recent hospitalization. Discussed F/U labs to check CBC next week. Discussed thrombocytopenic precautions.   Pt's mother, Ricky Doan stated that patient was going to inpatient [facility] on Friday and that during intake process would make staff aware of need to check CBC D on patient next week per Dr Truett Perna recommendations. Gave Mother contact information to call with any issues or questions.    Will continue to follow    Time spent counseling/coordinating care: 30-45 minutes

## 2022-01-03 ENCOUNTER — Encounter: Payer: Self-pay | Admitting: *Deleted

## 2022-01-03 ENCOUNTER — Telehealth: Payer: Self-pay | Admitting: *Deleted

## 2022-01-03 NOTE — Progress Notes (Signed)
Spoke with Ms Zou's mother, Ms Bataille is now inpatient at [facility] and at intake her platelet count was reported to be 58,000 up  from 38,000. Mrs Cheesman will contact Nurse Navigator with update on CBC results as well as D/C plan and then will set up appt with Dr Benay Spice after D/C from [place] Mother given contact number and information

## 2022-01-08 NOTE — Discharge Summary (Signed)
Physician Discharge Summary  Taylor [name] XHB:[id number] DOB: Jul 09, 1981 DOA: 12/25/2021  PCP: Barbie Banner, MD  Admit date: 12/25/2021 Discharge date: 12/28/2021  Time spent: 20 minutes    Patient left AMA  Recommendations for Outpatient Follow-up:  Patient left AMA and hopefully will follow-up with PCP and at [facility].   Discharge Diagnoses:  Principal Problem:   Alcohol withdrawal (HCC) Active Problems:   Alcohol use disorder, severe, in early remission, dependence (HCC)   Generalized anxiety disorder   Thrombocytopenia (HCC)   Neutropenia (HCC)   Hypokalemia   Hypomagnesemia   Discharge Condition: Patient left AMA  Diet recommendation: Regular diet  Filed Weights   12/25/21 2027  Weight: 59 kg    History of present illness:  HPI per Dr. Carlton Adam is a 41 y.o. female with medical history significant of anxiety/depression, EtOH abuse. Presenting with tremors, agitation. She reports that she is trying to quit alcohol. She had a drink yesterday and has felt tremulous and agitated since. She knew she was going to go into withdrawal, so she came to the ED for help. She denies any other aggravating or alleviating factors.     ED Course: She was started on librium, but began vomiting. She remained tremulous. She was started on CIWA. [facility] was called for admission.    Hospital Course:   Patient left AMA. For rest of hospitalization please see progress note dictated 12/28/2021.   Procedures: CT abdomen and pelvis 12/28/2021  Consultations: Hematology: Dr. Truett Perna 12/28/2021  Discharge Exam: Vitals:   12/28/21 1412 12/28/21 2219  BP: (!) 150/98 118/88  Pulse: (!) 107 (!) 117  Resp: 16 16  Temp: 97.7 F (36.5 C) 98.6 F (37 C)  SpO2: 97% 98%    General: Patient left AMA Cardiovascular: Patient left AMA Respiratory: Patient left AMA  Discharge Instructions    Allergies as of 12/28/2021       Reactions   Bee Venom Anaphylaxis, Other (See  Comments)   Allergic to Bee stings         Medication List     ASK your doctor about these medications    ALPRAZolam 0.25 MG tablet Commonly known as: XANAX Take 0.5 mg by mouth at bedtime as needed for sleep.   amphetamine-dextroamphetamine 30 MG tablet Commonly known as: ADDERALL Take 20 mg by mouth in the morning and at bedtime. Take 2/3 tablet (20 mg) BID   buPROPion 300 MG 24 hr tablet Commonly known as: WELLBUTRIN XL Take 1 tablet (300 mg total) by mouth daily after breakfast.   drospirenone-ethinyl estradiol 3-0.02 MG tablet Commonly known as: YAZ Take 1 tablet by mouth every evening.   ondansetron 8 MG disintegrating tablet Commonly known as: Zofran ODT Take 1 tablet (8 mg total) by mouth every 8 (eight) hours as needed for nausea or vomiting.   pantoprazole 40 MG tablet Commonly known as: PROTONIX Take 40 mg by mouth daily.       Allergies  Allergen Reactions   Bee Venom Anaphylaxis and Other (See Comments)    Allergic to Bee stings       The results of significant diagnostics from this hospitalization (including imaging, microbiology, ancillary and laboratory) are listed below for reference.    Significant Diagnostic Studies: CT ABDOMEN PELVIS W CONTRAST  Result Date: 12/28/2021 CLINICAL DATA:  Portal hypertension, thrombocytopenia EXAM: CT ABDOMEN AND PELVIS WITH CONTRAST TECHNIQUE: Multidetector CT imaging of the abdomen and pelvis was performed using the standard protocol following bolus  administration of intravenous contrast. CONTRAST:  69mL OMNIPAQUE IOHEXOL 350 MG/ML SOLN COMPARISON:  02/05/2019 FINDINGS: Lower chest: Unremarkable. Hepatobiliary: Liver measures 14.9 cm in length. There is fatty infiltration. There is minimal nodularity in the liver surface. In image 32 of series 2, there is a linear low-density lesion in the liver measuring 2.1 x 0.5 cm. In image 26, there is 2.4 cm low-density close to the porta hepatis. These findings were not  evident in the previous examination. Gallbladder is distended. There is no wall thickening in gallbladder. There is no dilation of bile ducts. Pancreas: No focal abnormality is seen. Spleen: Spleen is not enlarged. Adrenals/Urinary Tract: Adrenals are unremarkable. There is no hydronephrosis. There are no renal or ureteral stones. Urinary bladder is unremarkable. There is 12 mm low-density lesion in the midportion of left kidney, possibly a cyst. Stomach/Bowel: Stomach is not distended. Small bowel loops are not dilated. Appendix is of normal caliber. There is no significant wall thickening in colon. There is no pericolic stranding. Vascular/Lymphatic: Unremarkable. Reproductive: Unremarkable. Other: There is no ascites or pneumoperitoneum. Small umbilical hernia containing fat is seen. Musculoskeletal: Unremarkable. IMPRESSION: There is no evidence intestinal obstruction or pneumoperitoneum. There is no hydronephrosis. Appendix is not dilated. Fatty liver. There is minimal nodularity in the liver surface which may be normal variation or suggest possible early cirrhosis. Spleen is not enlarged. No definite signs portosystemic shunt are noted. There is 2.1 x 0.5 cm linear low-attenuation in the liver. There is another 2.4 cm area of low attenuation in the liver close to porta hepatis. Findings may suggest benign process such as cysts or hemangiomas or some other neoplastic process. These findings were not seen on 02/05/2019. Short-term follow-up multiphasic CT or MRI may be considered. Possible left renal cyst. Other findings as described in the body of the report. Electronically Signed   By: Ernie Avena M.D.   On: 12/28/2021 12:59    Microbiology: No results found for this or any previous visit (from the past 240 hour(s)).   Labs: Basic Metabolic Panel: No results for input(s): NA, K, CL, CO2, GLUCOSE, BUN, CREATININE, CALCIUM, MG, PHOS in the last 168 hours. Liver Function Tests: No results for  input(s): AST, ALT, ALKPHOS, BILITOT, PROT, ALBUMIN in the last 168 hours. No results for input(s): LIPASE, AMYLASE in the last 168 hours. No results for input(s): AMMONIA in the last 168 hours. CBC: No results for input(s): WBC, NEUTROABS, HGB, HCT, MCV, PLT in the last 168 hours. Cardiac Enzymes: No results for input(s): CKTOTAL, CKMB, CKMBINDEX, TROPONINI in the last 168 hours. BNP: BNP (last 3 results) No results for input(s): BNP in the last 8760 hours.  ProBNP (last 3 results) No results for input(s): PROBNP in the last 8760 hours.  CBG: No results for input(s): GLUCAP in the last 168 hours.     Signed:  Ramiro Harvest MD.  [facility] 01/08/2022, 6:32 PM

## 2022-01-25 ENCOUNTER — Emergency Department (HOSPITAL_BASED_OUTPATIENT_CLINIC_OR_DEPARTMENT_OTHER)
Admission: EM | Admit: 2022-01-25 | Discharge: 2022-01-25 | Disposition: A | Discharge status: Home / Self Care | Payer: BLUE CROSS/BLUE SHIELD | Attending: Emergency Medicine | Admitting: Emergency Medicine

## 2022-01-25 ENCOUNTER — Encounter (HOSPITAL_BASED_OUTPATIENT_CLINIC_OR_DEPARTMENT_OTHER): Payer: Self-pay

## 2022-01-25 ENCOUNTER — Other Ambulatory Visit: Payer: Self-pay

## 2022-01-25 ENCOUNTER — Emergency Department (HOSPITAL_BASED_OUTPATIENT_CLINIC_OR_DEPARTMENT_OTHER): Payer: BLUE CROSS/BLUE SHIELD

## 2022-01-25 DIAGNOSIS — R1031 Right lower quadrant pain: Secondary | ICD-10-CM | POA: Diagnosis present

## 2022-01-25 DIAGNOSIS — R109 Unspecified abdominal pain: Secondary | ICD-10-CM

## 2022-01-25 LAB — LIPASE, BLOOD: Lipase: 20 U/L (ref 11–51)

## 2022-01-25 LAB — COMPREHENSIVE METABOLIC PANEL
ALT: 32 U/L (ref 0–44)
AST: 26 U/L (ref 15–41)
Albumin: 4.3 g/dL (ref 3.5–5.0)
Alkaline Phosphatase: 41 U/L (ref 38–126)
Anion gap: 11 (ref 5–15)
BUN: 10 mg/dL (ref 6–20)
CO2: 21 mmol/L — ABNORMAL LOW (ref 22–32)
Calcium: 9.6 mg/dL (ref 8.9–10.3)
Chloride: 104 mmol/L (ref 98–111)
Creatinine, Ser: 0.77 mg/dL (ref 0.44–1.00)
GFR, Estimated: >60 mL/min (ref >60)
Glucose, Bld: 90 mg/dL (ref 70–99)
Potassium: 3.8 mmol/L (ref 3.5–5.1)
Sodium: 136 mmol/L (ref 135–145)
Total Bilirubin: 0.2 mg/dL — ABNORMAL LOW (ref 0.3–1.2)
Total Protein: 7.3 g/dL (ref 6.5–8.1)

## 2022-01-25 LAB — CBC
HCT: 38.5 % (ref 36.0–46.0)
Hemoglobin: 12.8 g/dL (ref 12.0–15.0)
MCH: 30.8 pg (ref 26.0–34.0)
MCHC: 33.2 g/dL (ref 30.0–36.0)
MCV: 92.5 fL (ref 80.0–100.0)
Platelets: 224 10*3/uL (ref 150–400)
RBC: 4.16 MIL/uL (ref 3.87–5.11)
RDW: 13.2 % (ref 11.5–15.5)
WBC: 6.5 10*3/uL (ref 4.0–10.5)
nRBC: 0 % (ref 0.0–0.2)

## 2022-01-25 LAB — PREGNANCY, URINE: Preg Test, Ur: NEGATIVE

## 2022-01-25 LAB — URINALYSIS, ROUTINE W REFLEX MICROSCOPIC
Bilirubin Urine: NEGATIVE
Glucose, UA: NEGATIVE mg/dL
Hgb urine dipstick: NEGATIVE
Ketones, ur: NEGATIVE mg/dL
Leukocytes,Ua: NEGATIVE
Nitrite: NEGATIVE
Protein, ur: NEGATIVE mg/dL
Specific Gravity, Urine: 1.009 (ref 1.005–1.030)
pH: 7 (ref 5.0–8.0)

## 2022-01-25 MED ORDER — IOHEXOL 300 MG/ML  SOLN
100.0000 mL | Freq: Once | INTRAMUSCULAR | Status: AC | PRN
  Administered 2022-01-25: 100 mL via INTRAVENOUS

## 2022-01-25 MED ORDER — DICYCLOMINE HCL 20 MG PO TABS: 20.0000 mg | ORAL_TABLET | Freq: Two times a day (BID) | ORAL | 0 refills | Status: DC

## 2022-01-25 MED ORDER — DICYCLOMINE HCL 10 MG PO CAPS
10.0000 mg | ORAL_CAPSULE | Freq: Once | ORAL | Status: AC
  Administered 2022-01-25: 10 mg via ORAL
  Filled 2022-01-25: qty 1

## 2022-01-25 NOTE — ED Provider Notes (Addendum)
[facility] EMERGENCY DEPT Provider Note   CSN: [id number] Arrival date & time: 01/25/22  2013     History  Chief Complaint  Patient presents with   Abdominal Pain    Taylor [name] is a 41 y.o. female.  41 year old female who presents with right lower and suprapubic pain x24 hours.  Denies any nausea or vomiting.  No constipation.  No fevers or diarrhea.  Pain has been persistent and worse with palpation.  No prior history of same.  Denies any vaginal bleeding or discharge.      Home Medications Prior to Admission medications   Medication Sig Start Date End Date Taking? Authorizing Provider  ALPRAZolam Prudy Feeler) 0.25 MG tablet Take 0.5 mg by mouth at bedtime as needed for sleep. 02/10/21   [provider]  amphetamine-dextroamphetamine (ADDERALL) 30 MG tablet Take 20 mg by mouth in the morning and at bedtime. Take 2/3 tablet (20 mg) BID 01/29/21   [provider]  buPROPion (WELLBUTRIN XL) 300 MG 24 hr tablet Take 1 tablet (300 mg total) by mouth daily after breakfast. 06/13/19   Chauncey Mann, MD  drospirenone-ethinyl estradiol (YAZ) 3-0.02 MG tablet Take 1 tablet by mouth every evening. 05/17/19   [provider]  ondansetron (ZOFRAN ODT) 8 MG disintegrating tablet Take 1 tablet (8 mg total) by mouth every 8 (eight) hours as needed for nausea or vomiting. 10/23/21   Molpus, John, MD  pantoprazole (PROTONIX) 40 MG tablet Take 40 mg by mouth daily.    [provider]      Allergies    Bee venom    Review of Systems   Review of Systems  All other systems reviewed and are negative.  Physical Exam Updated Vital Signs BP (!) 153/98    Pulse 85    Temp 97.8 F (36.6 C)    Resp 16    Ht 1.575 m (5\' 2" )    Wt 58.1 kg    SpO2 99%    BMI 23.41 kg/m  Physical Exam Vitals and nursing note reviewed.  Constitutional:      General: She is not in acute distress.    Appearance: Normal appearance. She is well-developed. She is not  toxic-appearing.  HENT:     Head: Normocephalic and atraumatic.  Eyes:     General: Lids are normal.     Conjunctiva/sclera: Conjunctivae normal.     Pupils: Pupils are equal, round, and reactive to light.  Neck:     Thyroid: No thyroid mass.     Trachea: No tracheal deviation.  Cardiovascular:     Rate and Rhythm: Normal rate and regular rhythm.     Heart sounds: Normal heart sounds. No murmur heard.   No gallop.  Pulmonary:     Effort: Pulmonary effort is normal. No respiratory distress.     Breath sounds: Normal breath sounds. No stridor. No decreased breath sounds, wheezing, rhonchi or rales.  Abdominal:     General: There is no distension.     Palpations: Abdomen is soft.     Tenderness: There is no abdominal tenderness. There is no guarding or rebound.    Musculoskeletal:        General: No tenderness. Normal range of motion.     Cervical back: Normal range of motion and neck supple.  Skin:    General: Skin is warm and dry.     Findings: No abrasion or rash.  Neurological:     Mental Status: She is alert  and oriented to person, place, and time. Mental status is at baseline.     GCS: GCS eye subscore is 4. GCS verbal subscore is 5. GCS motor subscore is 6.     Cranial Nerves: No cranial nerve deficit.     Sensory: No sensory deficit.     Motor: Motor function is intact.  Psychiatric:        Attention and Perception: Attention normal.        Speech: Speech normal.        Behavior: Behavior normal.    ED Results / Procedures / Treatments   Labs (all labs ordered are listed, but only abnormal results are displayed) Labs Reviewed  COMPREHENSIVE METABOLIC PANEL - Abnormal; Notable for the following components:      Result Value   CO2 21 (*)    Total Bilirubin 0.2 (*)    All other components within normal limits  LIPASE, BLOOD  CBC  URINALYSIS, ROUTINE W REFLEX MICROSCOPIC  PREGNANCY, URINE    EKG None  Radiology No results found.  Procedures Procedures     Medications Ordered in ED Medications - No data to display  ED Course/ Medical Decision Making/ A&P                           Medical Decision Making Amount and/or Complexity of Data Reviewed Labs: ordered. Radiology: ordered.  Risk Prescription drug management.   Patient is labs and CT scans here are overall reassuring.  No evidence of bowel obstruction or appendicitis.  Her abdomen is benign at this time.  No concern for pelvic etiology of her pain such as PID.  No evidence pyelonephritis as urinalysis is negative.  Return precautions given        Final Clinical Impression(s) / ED Diagnoses Final diagnoses:  None    Rx / DC Orders ED Discharge Orders     None         Lorre Nick, MD 01/25/22 2317    Lorre Nick, MD 01/25/22 2318

## 2022-01-25 NOTE — ED Triage Notes (Signed)
Patient here POV from Home with ABD Pain.  Pain is RLQ and began last PM. Patient was able to sleep but awoke with Pain this AM and has continued and worsened since. RLQ and radiates to LLQ and Lower Back.  No Nausea. No Vomiting. No Constipation. No Diarrhea. No Fevers. Endorses Urinary Frequency.  NAD Noted during Triage. A&Ox4. GCS 15. Ambulatory.

## 2022-04-03 ENCOUNTER — Emergency Department (HOSPITAL_COMMUNITY)
Admission: EM | Admit: 2022-04-03 | Discharge: 2022-04-04 | Discharge status: Against Medical Advice | Payer: BLUE CROSS/BLUE SHIELD | Attending: Physician Assistant | Admitting: Physician Assistant

## 2022-04-03 ENCOUNTER — Encounter (HOSPITAL_COMMUNITY): Payer: Self-pay | Admitting: Emergency Medicine

## 2022-04-03 ENCOUNTER — Other Ambulatory Visit: Payer: Self-pay

## 2022-04-03 ENCOUNTER — Emergency Department (HOSPITAL_COMMUNITY): Payer: BLUE CROSS/BLUE SHIELD

## 2022-04-03 DIAGNOSIS — R300 Dysuria: Secondary | ICD-10-CM | POA: Diagnosis not present

## 2022-04-03 DIAGNOSIS — R1031 Right lower quadrant pain: Secondary | ICD-10-CM | POA: Insufficient documentation

## 2022-04-03 DIAGNOSIS — Z5321 Procedure and treatment not carried out due to patient leaving prior to being seen by health care provider: Secondary | ICD-10-CM | POA: Insufficient documentation

## 2022-04-03 DIAGNOSIS — Y908 Blood alcohol level of 240 mg/100 ml or more: Secondary | ICD-10-CM | POA: Insufficient documentation

## 2022-04-03 DIAGNOSIS — F1010 Alcohol abuse, uncomplicated: Secondary | ICD-10-CM | POA: Diagnosis present

## 2022-04-03 LAB — CBC WITH DIFFERENTIAL/PLATELET
Abs Immature Granulocytes: 0.01 10*3/uL (ref 0.00–0.07)
Basophils Absolute: 0.1 10*3/uL (ref 0.0–0.1)
Basophils Relative: 3 %
Eosinophils Absolute: 0 10*3/uL (ref 0.0–0.5)
Eosinophils Relative: 1 %
HCT: 40.5 % (ref 36.0–46.0)
Hemoglobin: 14 g/dL (ref 12.0–15.0)
Immature Granulocytes: 0 %
Lymphocytes Relative: 47 %
Lymphs Abs: 1.8 10*3/uL (ref 0.7–4.0)
MCH: 30.5 pg (ref 26.0–34.0)
MCHC: 34.6 g/dL (ref 30.0–36.0)
MCV: 88.2 fL (ref 80.0–100.0)
Monocytes Absolute: 0.4 10*3/uL (ref 0.1–1.0)
Monocytes Relative: 11 %
Neutro Abs: 1.4 10*3/uL — ABNORMAL LOW (ref 1.7–7.7)
Neutrophils Relative %: 38 %
Platelets: 214 10*3/uL (ref 150–400)
RBC: 4.59 MIL/uL (ref 3.87–5.11)
RDW: 15.4 % (ref 11.5–15.5)
WBC: 3.7 10*3/uL — ABNORMAL LOW (ref 4.0–10.5)
nRBC: 0 % (ref 0.0–0.2)

## 2022-04-03 LAB — COMPREHENSIVE METABOLIC PANEL
ALT: 44 U/L (ref 0–44)
AST: 55 U/L — ABNORMAL HIGH (ref 15–41)
Albumin: 3.7 g/dL (ref 3.5–5.0)
Alkaline Phosphatase: 55 U/L (ref 38–126)
Anion gap: 9 (ref 5–15)
BUN: 11 mg/dL (ref 6–20)
CO2: 22 mmol/L (ref 22–32)
Calcium: 8.6 mg/dL — ABNORMAL LOW (ref 8.9–10.3)
Chloride: 108 mmol/L (ref 98–111)
Creatinine, Ser: 0.63 mg/dL (ref 0.44–1.00)
GFR, Estimated: >60 mL/min (ref >60)
Glucose, Bld: 88 mg/dL (ref 70–99)
Potassium: 3.8 mmol/L (ref 3.5–5.1)
Sodium: 139 mmol/L (ref 135–145)
Total Bilirubin: 0.4 mg/dL (ref 0.3–1.2)
Total Protein: 7.2 g/dL (ref 6.5–8.1)

## 2022-04-03 LAB — SALICYLATE LEVEL: Salicylate Lvl: <7 mg/dL — ABNORMAL LOW (ref 7.0–30.0)

## 2022-04-03 LAB — I-STAT BETA HCG BLOOD, ED (MC, WL, AP ONLY): I-stat hCG, quantitative: <5 m[IU]/mL (ref <5)

## 2022-04-03 LAB — ETHANOL: Alcohol, Ethyl (B): 391 mg/dL (ref <10)

## 2022-04-03 LAB — ACETAMINOPHEN LEVEL: Acetaminophen (Tylenol), Serum: <10 ug/mL — ABNORMAL LOW (ref 10–30)

## 2022-04-03 NOTE — ED Provider Triage Note (Signed)
Emergency Medicine Provider Triage Evaluation Note ? ?Taylor [name] , a 41 y.o. female  was evaluated in triage.  Pt complains of fear of going into withdrawal. ?She states that she recently started drinking again.  She has been drinking about 12 shots a day.  Her last drink was 2 hours ago.  She is not currently feeling any withdrawal symptoms. ? ?Patient loudly talking with her boyfriend prior to triage provider and RN entering.   ? ? ? ?Physical Exam  ?BP (!) 156/107 (BP Location: Left Arm)   Pulse (!) 124   Temp 98.4 ?F (36.9 ?C) (Oral)   Resp 20   SpO2 94%  ?Gen:   Awake, no distress   ?Resp:  Normal effort  ?MSK:   Moves extremities without difficulty  ?Other:  Normal speech.  ? ?Medical Decision Making  ?Medically screening exam initiated at 10:00 PM.  Appropriate orders placed.  Taylor [name] was informed that the remainder of the evaluation will be completed by another provider, this initial triage assessment does not replace that evaluation, and the importance of remaining in the ED until their evaluation is complete. ? ?  ?Cristina Gong, PA-C ?04/03/22 2203 ? ?

## 2022-04-03 NOTE — ED Triage Notes (Signed)
Pt brought to ED by boyfriend for evaluation of EToH withdrawal. Pt states he has been drinking all week, last drink was 2 hrs prior to arrival and fears that she will go into withdrawals. States she recently went to [facility] in March for detox.  ?

## 2022-04-03 NOTE — ED Notes (Signed)
Patient left.

## 2022-04-04 ENCOUNTER — Encounter (HOSPITAL_COMMUNITY): Payer: Self-pay

## 2022-04-04 ENCOUNTER — Emergency Department (HOSPITAL_COMMUNITY): Payer: BLUE CROSS/BLUE SHIELD

## 2022-04-04 ENCOUNTER — Emergency Department (HOSPITAL_COMMUNITY)
Admission: EM | Admit: 2022-04-04 | Discharge: 2022-04-04 | Discharge status: Against Medical Advice | Payer: BLUE CROSS/BLUE SHIELD | Source: Home / Self Care

## 2022-04-04 DIAGNOSIS — R300 Dysuria: Secondary | ICD-10-CM | POA: Insufficient documentation

## 2022-04-04 DIAGNOSIS — Z5321 Procedure and treatment not carried out due to patient leaving prior to being seen by health care provider: Secondary | ICD-10-CM | POA: Insufficient documentation

## 2022-04-04 DIAGNOSIS — R1031 Right lower quadrant pain: Secondary | ICD-10-CM | POA: Insufficient documentation

## 2022-04-04 LAB — COMPREHENSIVE METABOLIC PANEL
ALT: 43 U/L (ref 0–44)
AST: 60 U/L — ABNORMAL HIGH (ref 15–41)
Albumin: 3.9 g/dL (ref 3.5–5.0)
Alkaline Phosphatase: 52 U/L (ref 38–126)
Anion gap: 11 (ref 5–15)
BUN: 13 mg/dL (ref 6–20)
CO2: 23 mmol/L (ref 22–32)
Calcium: 8.5 mg/dL — ABNORMAL LOW (ref 8.9–10.3)
Chloride: 104 mmol/L (ref 98–111)
Creatinine, Ser: 0.61 mg/dL (ref 0.44–1.00)
GFR, Estimated: >60 mL/min (ref >60)
Glucose, Bld: 91 mg/dL (ref 70–99)
Potassium: 3.5 mmol/L (ref 3.5–5.1)
Sodium: 138 mmol/L (ref 135–145)
Total Bilirubin: 0.9 mg/dL (ref 0.3–1.2)
Total Protein: 7.1 g/dL (ref 6.5–8.1)

## 2022-04-04 LAB — I-STAT BETA HCG BLOOD, ED (MC, WL, AP ONLY): I-stat hCG, quantitative: <5 m[IU]/mL (ref <5)

## 2022-04-04 LAB — CBC
HCT: 42.2 % (ref 36.0–46.0)
Hemoglobin: 14.6 g/dL (ref 12.0–15.0)
MCH: 31.5 pg (ref 26.0–34.0)
MCHC: 34.6 g/dL (ref 30.0–36.0)
MCV: 91.1 fL (ref 80.0–100.0)
Platelets: 185 10*3/uL (ref 150–400)
RBC: 4.63 MIL/uL (ref 3.87–5.11)
RDW: 15.9 % — ABNORMAL HIGH (ref 11.5–15.5)
WBC: 3.7 10*3/uL — ABNORMAL LOW (ref 4.0–10.5)
nRBC: 0 % (ref 0.0–0.2)

## 2022-04-04 LAB — LIPASE, BLOOD: Lipase: 45 U/L (ref 11–51)

## 2022-04-04 LAB — ETHANOL: Alcohol, Ethyl (B): 331 mg/dL (ref <10)

## 2022-04-04 NOTE — ED Triage Notes (Addendum)
Pt arrived via EMS, from home, c/o RLQ abd pain. Admits to ETOH use today. Concerned for withdrawal.  ?

## 2022-04-04 NOTE — ED Provider Triage Note (Addendum)
Emergency Medicine Provider Triage Evaluation Note ? ?Taylor [name] , a 41 y.o. female  was evaluated in triage.  Pt complains of RLQ abdominal pain onset today. Has associated dysuria. Still has her gallbladder and appendix. Denies fever, chills, hematuria, nausea, vomiting, diarrhea, constipation, vaginal bleeding, vaginal discharge.  Patient notes that she typically drinks 12 shots of vodka a day.  She notes prior to arrival she consumed wine, unable to tell me the exact amount. ? ?Review of Systems  ?Positive: As per HPI above ?Negative:  ? ?Physical Exam  ?BP (!) 141/98 (BP Location: Right Arm)   Pulse 100   Temp 98.3 ?F (36.8 ?C) (Oral)   Resp 19   SpO2 97%  ?Gen:   Awake, no distress   ?Resp:  Normal effort  ?MSK:   Moves extremities without difficulty  ?Other:  Tenderness to palpation noted to epigastric, suprapubic, right lower quadrant.  Positive psoas ? ?Medical Decision Making  ?Medically screening exam initiated at 8:24 AM.  Appropriate orders placed.  Taylor [name] was informed that the remainder of the evaluation will be completed by another provider, this initial triage assessment does not replace that evaluation, and the importance of remaining in the ED until their evaluation is complete. ? ?  ?Adaora Mchaney A, PA-C ?04/04/22 7681 ? ?10:06 AM notified by charge RN of ETOH of 331 ? ?  ?Renardo Cheatum A, PA-C ?04/04/22 1006 ? ?

## 2022-05-23 ENCOUNTER — Telehealth (HOSPITAL_COMMUNITY): Payer: Self-pay | Admitting: Licensed Clinical Social Worker

## 2022-05-23 NOTE — Telephone Encounter (Signed)
The therapist again attempts to reach [facility]; however, she does not answer and her voicemail box is full. Thus, the therapist will mail her a letter.  Myrna Blazer, MA, LCSW, [facility], LCAS 05/23/2022

## 2022-05-23 NOTE — Telephone Encounter (Signed)
The therapist receives a voicemail from [place] on 05/20/22 and attempts to return her call this afternoon; however, her voicemail box is full.   The therapist will try her again later today.  Myrna Blazer, MA, LCSW, [facility], LCAS 05/23/2022

## 2022-06-30 ENCOUNTER — Other Ambulatory Visit: Payer: Self-pay

## 2022-06-30 ENCOUNTER — Encounter (HOSPITAL_BASED_OUTPATIENT_CLINIC_OR_DEPARTMENT_OTHER): Payer: Self-pay | Admitting: Emergency Medicine

## 2022-06-30 ENCOUNTER — Other Ambulatory Visit (HOSPITAL_BASED_OUTPATIENT_CLINIC_OR_DEPARTMENT_OTHER): Payer: Self-pay

## 2022-06-30 ENCOUNTER — Emergency Department (HOSPITAL_BASED_OUTPATIENT_CLINIC_OR_DEPARTMENT_OTHER)
Admission: EM | Admit: 2022-06-30 | Discharge: 2022-06-30 | Disposition: A | Discharge status: Home / Self Care | Payer: BLUE CROSS/BLUE SHIELD | Attending: Emergency Medicine | Admitting: Emergency Medicine

## 2022-06-30 DIAGNOSIS — F10129 Alcohol abuse with intoxication, unspecified: Secondary | ICD-10-CM | POA: Diagnosis not present

## 2022-06-30 DIAGNOSIS — R110 Nausea: Secondary | ICD-10-CM | POA: Diagnosis present

## 2022-06-30 DIAGNOSIS — T512X1A Toxic effect of 2-Propanol, accidental (unintentional), initial encounter: Secondary | ICD-10-CM

## 2022-06-30 DIAGNOSIS — F1020 Alcohol dependence, uncomplicated: Secondary | ICD-10-CM

## 2022-06-30 HISTORY — DX: Alcohol abuse, uncomplicated: F10.10

## 2022-06-30 LAB — COMPREHENSIVE METABOLIC PANEL
ALT: 10 U/L (ref 0–44)
AST: 13 U/L — ABNORMAL LOW (ref 15–41)
Albumin: 4.4 g/dL (ref 3.5–5.0)
Alkaline Phosphatase: 42 U/L (ref 38–126)
Anion gap: 14 (ref 5–15)
BUN: 5 mg/dL — ABNORMAL LOW (ref 6–20)
CO2: 20 mmol/L — ABNORMAL LOW (ref 22–32)
Calcium: 9.1 mg/dL (ref 8.9–10.3)
Chloride: 103 mmol/L (ref 98–111)
Creatinine, Ser: 0.91 mg/dL (ref 0.44–1.00)
GFR, Estimated: >60 mL/min (ref >60)
Glucose, Bld: 100 mg/dL — ABNORMAL HIGH (ref 70–99)
Potassium: 3.4 mmol/L — ABNORMAL LOW (ref 3.5–5.1)
Sodium: 137 mmol/L (ref 135–145)
Total Bilirubin: 0.2 mg/dL — ABNORMAL LOW (ref 0.3–1.2)
Total Protein: 8.1 g/dL (ref 6.5–8.1)

## 2022-06-30 LAB — CBC
HCT: 42.6 % (ref 36.0–46.0)
Hemoglobin: 14.5 g/dL (ref 12.0–15.0)
MCH: 31 pg (ref 26.0–34.0)
MCHC: 34 g/dL (ref 30.0–36.0)
MCV: 91 fL (ref 80.0–100.0)
Platelets: 216 10*3/uL (ref 150–400)
RBC: 4.68 MIL/uL (ref 3.87–5.11)
RDW: 16.1 % — ABNORMAL HIGH (ref 11.5–15.5)
WBC: 10.6 10*3/uL — ABNORMAL HIGH (ref 4.0–10.5)
nRBC: 0 % (ref 0.0–0.2)

## 2022-06-30 LAB — PREGNANCY, URINE: Preg Test, Ur: NEGATIVE

## 2022-06-30 LAB — RAPID URINE DRUG SCREEN, HOSP PERFORMED
Amphetamines: NOT DETECTED
Barbiturates: NOT DETECTED
Benzodiazepines: POSITIVE — AB
Cocaine: NOT DETECTED
Opiates: NOT DETECTED
Tetrahydrocannabinol: NOT DETECTED

## 2022-06-30 LAB — ETHANOL: Alcohol, Ethyl (B): <10 mg/dL (ref <10)

## 2022-06-30 MED ORDER — FAMOTIDINE 20 MG PO TABS
20.0000 mg | ORAL_TABLET | Freq: Once | ORAL | Status: AC
  Administered 2022-06-30: 20 mg via ORAL
  Filled 2022-06-30: qty 1

## 2022-06-30 MED ORDER — ONDANSETRON HCL 4 MG/2ML IJ SOLN
4.0000 mg | Freq: Once | INTRAMUSCULAR | Status: AC
  Administered 2022-06-30: 4 mg via INTRAVENOUS
  Filled 2022-06-30: qty 2

## 2022-06-30 MED ORDER — CHLORDIAZEPOXIDE HCL 25 MG PO CAPS
ORAL_CAPSULE | ORAL | 0 refills | Status: DC
  Filled 2022-06-30: qty 6, 3d supply, fill #0

## 2022-06-30 MED ORDER — SODIUM CHLORIDE 0.9 % IV BOLUS
1000.0000 mL | Freq: Once | INTRAVENOUS | Status: AC
  Administered 2022-06-30: 1000 mL via INTRAVENOUS

## 2022-06-30 MED ORDER — ALUM & MAG HYDROXIDE-SIMETH 200-200-20 MG/5ML PO SUSP
30.0000 mL | Freq: Once | ORAL | Status: AC
  Administered 2022-06-30: 30 mL via ORAL
  Filled 2022-06-30: qty 30

## 2022-06-30 MED ORDER — POTASSIUM CHLORIDE 20 MEQ PO PACK
40.0000 meq | PACK | Freq: Once | ORAL | Status: AC
  Administered 2022-06-30: 40 meq via ORAL
  Filled 2022-06-30: qty 2

## 2022-06-30 MED ORDER — LORAZEPAM 2 MG/ML IJ SOLN
1.0000 mg | Freq: Once | INTRAMUSCULAR | Status: AC
  Administered 2022-06-30: 1 mg via INTRAVENOUS
  Filled 2022-06-30: qty 1

## 2022-06-30 MED ORDER — CHLORDIAZEPOXIDE HCL 25 MG PO CAPS: ORAL_CAPSULE | ORAL | 0 refills | Status: DC

## 2022-06-30 NOTE — ED Notes (Signed)
Drank rubbing alcohol within the last hour before arrival. Unsure how much she drank but notes that after she drank the rubbing alcohol, she immediatly vomited multiple times. No obvious bleeding while vomiting. Currently only having abdominal pain, upper middle and started before the rubbing alcohol but worsened after the vomiting. No other complaint. Last alcohol was a few days prior.

## 2022-06-30 NOTE — Discharge Instructions (Addendum)
It was our pleasure to provide your ER care today - we hope that you feel better.  Avoid alcohol use - follow up with treatment program - see resource guide provided.   Drink plenty of fluids/stay well hydrated. Take librium as need, as prescribed, if alcohol withdrawal symptoms - no driving when taking.   Return to ER if worse, new symptoms, fevers, new/severe pain, persistent vomiting, or other concern.

## 2022-06-30 NOTE — ED Triage Notes (Signed)
Pt reports drinking rubbing alcohol today, unsure of amount. Reports a hx of it. Denies any intentional harm.

## 2022-06-30 NOTE — ED Provider Notes (Signed)
[facility] EMERGENCY DEPT Provider Note   CSN: [id number] Arrival date & time: 06/30/22  1245     History  Chief Complaint  Patient presents with   Alcohol Intoxication    Taylor [name] is a 41 y.o. female.  Pt indicates hx etoh use disorder, and that had drank some rubbing alcohol and then did not feel well afterwards. Last etoh use was approximately 1-2 days prior. Notes mild stomach upset/nausea. No bloody emesis. No abd distension or focal abd pain. Having normal bms. States hx feeling shaky and ?etoh withdrawal sz in past - none recent. Denies hx dts. No headache. No chest pain or sob. No extremity pain or injury. No fever or chills. Denies any other ingestion. Denies depression or any thoughts of self harm.   The history is provided by the patient.  Alcohol Intoxication Pertinent negatives include no chest pain, no abdominal pain, no headaches and no shortness of breath.       Home Medications Prior to Admission medications   Medication Sig Start Date End Date Taking? Authorizing Provider  ALPRAZolam Prudy Feeler) 0.25 MG tablet Take 0.5 mg by mouth at bedtime as needed for sleep. 02/10/21   [provider]  amphetamine-dextroamphetamine (ADDERALL) 30 MG tablet Take 20 mg by mouth in the morning and at bedtime. Take 2/3 tablet (20 mg) BID 01/29/21   [provider]  buPROPion (WELLBUTRIN XL) 300 MG 24 hr tablet Take 1 tablet (300 mg total) by mouth daily after breakfast. 06/13/19   Chauncey Mann, MD  dicyclomine (BENTYL) 20 MG tablet Take 1 tablet (20 mg total) by mouth 2 (two) times daily. 01/25/22   Lorre Nick, MD  drospirenone-ethinyl estradiol (YAZ) 3-0.02 MG tablet Take 1 tablet by mouth every evening. 05/17/19   [provider]  ondansetron (ZOFRAN ODT) 8 MG disintegrating tablet Take 1 tablet (8 mg total) by mouth every 8 (eight) hours as needed for nausea or vomiting. 10/23/21   Molpus, John, MD  pantoprazole (PROTONIX) 40 MG  tablet Take 40 mg by mouth daily.    [provider]      Allergies    Bee venom    Review of Systems   Review of Systems  Constitutional:  Negative for chills and fever.  HENT:  Negative for sore throat.   Eyes:  Negative for visual disturbance.  Respiratory:  Negative for cough and shortness of breath.   Cardiovascular:  Negative for chest pain.  Gastrointestinal:  Positive for nausea. Negative for abdominal pain and vomiting.  Genitourinary:  Negative for dysuria and flank pain.  Musculoskeletal:  Negative for back pain and neck pain.  Skin:  Negative for rash.  Neurological:  Negative for headaches.  Hematological:  Does not bruise/bleed easily.  Psychiatric/Behavioral:  Negative for confusion.     Physical Exam Updated Vital Signs BP 138/89 (BP Location: Left Arm)   Pulse (!) 104   Temp 97.9 F (36.6 C)   Resp 16   Ht 1.575 m (5\' 2" )   Wt 58.1 kg   SpO2 99%   BMI 23.41 kg/m  Physical Exam Vitals and nursing note reviewed.  Constitutional:      Appearance: Normal appearance. She is well-developed.  HENT:     Head: Atraumatic.     Nose: Nose normal.     Mouth/Throat:     Mouth: Mucous membranes are moist.  Eyes:     General: No scleral icterus.    Conjunctiva/sclera: Conjunctivae normal.  Pupils: Pupils are equal, round, and reactive to light.  Neck:     Trachea: No tracheal deviation.  Cardiovascular:     Rate and Rhythm: Normal rate and regular rhythm.     Pulses: Normal pulses.     Heart sounds: Normal heart sounds. No murmur heard.    No friction rub. No gallop.  Pulmonary:     Effort: Pulmonary effort is normal. No respiratory distress.     Breath sounds: Normal breath sounds.  Abdominal:     General: Bowel sounds are normal. There is no distension.     Palpations: Abdomen is soft.     Tenderness: There is no abdominal tenderness. There is no guarding.  Genitourinary:    Comments: No cva tenderness.  Musculoskeletal:        General:  No swelling or tenderness.     Cervical back: Normal range of motion and neck supple. No rigidity. No muscular tenderness.  Skin:    General: Skin is warm and dry.     Findings: No rash.  Neurological:     Mental Status: She is alert.     Comments: Alert, speech normal. Motor/sens grossly intact bil. No gross tremor or shakes.   Psychiatric:        Mood and Affect: Mood normal.     ED Results / Procedures / Treatments   Labs (all labs ordered are listed, but only abnormal results are displayed) Results for orders placed or performed during the hospital encounter of 06/30/22  Comprehensive metabolic panel  Result Value Ref Range   Sodium 137 135 - 145 mmol/L   Potassium 3.4 (L) 3.5 - 5.1 mmol/L   Chloride 103 98 - 111 mmol/L   CO2 20 (L) 22 - 32 mmol/L   Glucose, Bld 100 (H) 70 - 99 mg/dL   BUN 5 (L) 6 - 20 mg/dL   Creatinine, Ser 6.38 0.44 - 1.00 mg/dL   Calcium 9.1 8.9 - 75.6 mg/dL   Total Protein 8.1 6.5 - 8.1 g/dL   Albumin 4.4 3.5 - 5.0 g/dL   AST 13 (L) 15 - 41 U/L   ALT 10 0 - 44 U/L   Alkaline Phosphatase 42 38 - 126 U/L   Total Bilirubin 0.2 (L) 0.3 - 1.2 mg/dL   GFR, Estimated >43 >32 mL/min   Anion gap 14 5 - 15  Ethanol  Result Value Ref Range   Alcohol, Ethyl (B) <10 <10 mg/dL  cbc  Result Value Ref Range   WBC 10.6 (H) 4.0 - 10.5 K/uL   RBC 4.68 3.87 - 5.11 MIL/uL   Hemoglobin 14.5 12.0 - 15.0 g/dL   HCT 95.1 88.4 - 16.6 %   MCV 91.0 80.0 - 100.0 fL   MCH 31.0 26.0 - 34.0 pg   MCHC 34.0 30.0 - 36.0 g/dL   RDW 06.3 (H) 01.6 - 01.0 %   Platelets 216 150 - 400 K/uL   nRBC 0.0 0.0 - 0.2 %     EKG None  Radiology No results found.  Procedures Procedures    Medications Ordered in ED Medications  sodium chloride 0.9 % bolus 1,000 mL (has no administration in time range)  LORazepam (ATIVAN) injection 1 mg (has no administration in time range)  alum & mag hydroxide-simeth (MAALOX/MYLANTA) 200-200-20 MG/5ML suspension 30 mL (has no administration  in time range)  famotidine (PEPCID) tablet 20 mg (has no administration in time range)    ED Course/ Medical Decision Making/ A&P  Medical Decision Making Problems Addressed: Chronic alcoholism (HCC): chronic illness or injury with exacerbation, progression, or side effects of treatment that poses a threat to life or bodily functions Nausea: acute illness or injury with systemic symptoms Toxic effect of rubbing alcohol, accidental or unintentional, initial encounter: acute illness or injury with systemic symptoms that poses a threat to life or bodily functions  Amount and/or Complexity of Data Reviewed External Data Reviewed: notes. Labs: ordered. Decision-making details documented in ED Course.  Risk OTC drugs. Prescription drug management. Decision regarding hospitalization.   Iv ns. Continuous pulse ox and cardiac monitoring. Labs ordered/sent. Imaging ordered.   Diff dx includes gastritis, GE, dehydration, intoxication, etc - Disposition decision including potential need for admission considered - will get labs, give ivf, and do po trial, and reassess.   Reviewed nursing notes and prior charts for additional history. External reports reviewed. Additional history from:  Cardiac monitor: sinus rhythm, rate 96.  Labs reviewed/interpreted by me - hgb normal. K sl low. Kcl po.   Ns bolus iv. Ativan iv. Pepcid po. Maalox po. Zofran iv.   Pt tolerating po, no nv.  Currently hr 92, rr 16. Abd soft non tender.   Pt appears stable for d/c.  Return precautions provided.            Final Clinical Impression(s) / ED Diagnoses Final diagnoses:  None    Rx / DC Orders ED Discharge Orders     None         Cathren Laine, MD 06/30/22 1722

## 2022-06-30 NOTE — ED Notes (Signed)
Pts discharge being held until father arrives and fluids are done.

## 2022-10-11 ENCOUNTER — Other Ambulatory Visit: Payer: Self-pay | Admitting: Family Medicine

## 2022-10-11 ENCOUNTER — Ambulatory Visit
Admission: RE | Admit: 2022-10-11 | Discharge: 2022-10-11 | Disposition: A | Discharge status: Home / Self Care | Payer: BLUE CROSS/BLUE SHIELD | Source: Ambulatory Visit | Attending: Family Medicine | Admitting: Family Medicine

## 2022-10-11 DIAGNOSIS — J690 Pneumonitis due to inhalation of food and vomit: Secondary | ICD-10-CM

## 2022-11-03 ENCOUNTER — Other Ambulatory Visit: Payer: Self-pay | Admitting: Family Medicine

## 2022-11-03 ENCOUNTER — Ambulatory Visit
Admission: RE | Admit: 2022-11-03 | Discharge: 2022-11-03 | Disposition: A | Discharge status: Home / Self Care | Payer: BLUE CROSS/BLUE SHIELD | Source: Ambulatory Visit | Attending: Family Medicine | Admitting: Family Medicine

## 2022-11-03 DIAGNOSIS — R0781 Pleurodynia: Secondary | ICD-10-CM

## 2022-11-03 DIAGNOSIS — R053 Chronic cough: Secondary | ICD-10-CM

## 2023-03-29 ENCOUNTER — Emergency Department (HOSPITAL_BASED_OUTPATIENT_CLINIC_OR_DEPARTMENT_OTHER)
Admission: EM | Admit: 2023-03-29 | Discharge: 2023-03-29 | Disposition: A | Discharge status: Home / Self Care | Payer: BLUE CROSS/BLUE SHIELD | Attending: Emergency Medicine | Admitting: Emergency Medicine

## 2023-03-29 ENCOUNTER — Encounter (HOSPITAL_BASED_OUTPATIENT_CLINIC_OR_DEPARTMENT_OTHER): Payer: Self-pay | Admitting: Emergency Medicine

## 2023-03-29 ENCOUNTER — Other Ambulatory Visit: Payer: Self-pay

## 2023-03-29 DIAGNOSIS — S0083XA Contusion of other part of head, initial encounter: Secondary | ICD-10-CM | POA: Insufficient documentation

## 2023-03-29 DIAGNOSIS — S0033XA Contusion of nose, initial encounter: Secondary | ICD-10-CM | POA: Insufficient documentation

## 2023-03-29 DIAGNOSIS — Y92219 Unspecified school as the place of occurrence of the external cause: Secondary | ICD-10-CM | POA: Diagnosis not present

## 2023-03-29 DIAGNOSIS — S0992XA Unspecified injury of nose, initial encounter: Secondary | ICD-10-CM | POA: Diagnosis present

## 2023-03-29 DIAGNOSIS — Y990 Civilian activity done for income or pay: Secondary | ICD-10-CM | POA: Insufficient documentation
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
## 2023-04-25 ENCOUNTER — Encounter (HOSPITAL_BASED_OUTPATIENT_CLINIC_OR_DEPARTMENT_OTHER): Payer: Self-pay | Admitting: *Deleted

## 2023-04-25 ENCOUNTER — Emergency Department (HOSPITAL_BASED_OUTPATIENT_CLINIC_OR_DEPARTMENT_OTHER)
Admission: EM | Admit: 2023-04-25 | Discharge: 2023-04-25 | Disposition: A | Discharge status: Home / Self Care | Payer: BLUE CROSS/BLUE SHIELD | Attending: Emergency Medicine | Admitting: Emergency Medicine

## 2023-04-25 ENCOUNTER — Other Ambulatory Visit: Payer: Self-pay

## 2023-04-25 DIAGNOSIS — W228XXA Striking against or struck by other objects, initial encounter: Secondary | ICD-10-CM | POA: Diagnosis not present

## 2023-04-25 DIAGNOSIS — S0081XA Abrasion of other part of head, initial encounter: Secondary | ICD-10-CM

## 2023-04-25 DIAGNOSIS — S0993XA Unspecified injury of face, initial encounter: Secondary | ICD-10-CM | POA: Diagnosis present

## 2023-04-25 DIAGNOSIS — F17210 Nicotine dependence, cigarettes, uncomplicated: Secondary | ICD-10-CM | POA: Insufficient documentation

## 2023-04-25 DIAGNOSIS — S0083XA Contusion of other part of head, initial encounter: Secondary | ICD-10-CM | POA: Diagnosis not present

## 2023-04-25 HISTORY — DX: Premenstrual dysphoric disorder: F32.81

## 2023-04-25 HISTORY — DX: Other specified behavioral and emotional disorders with onset usually occurring in childhood and adolescence: F98.8
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
## 2023-04-25 NOTE — ED Notes (Signed)
Called pt's mother at her request and didn't receive an answer, message left.

## 2023-04-25 NOTE — ED Notes (Signed)
Called pt's father at her request for a ride home, no answer rec'd, message left.

## 2023-04-26 ENCOUNTER — Ambulatory Visit (HOSPITAL_COMMUNITY)
Admission: EM | Admit: 2023-04-26 | Discharge: 2023-04-26 | Disposition: A | Discharge status: Home / Self Care | Payer: BLUE CROSS/BLUE SHIELD | Attending: Nurse Practitioner | Admitting: Nurse Practitioner

## 2023-04-26 DIAGNOSIS — F329 Major depressive disorder, single episode, unspecified: Secondary | ICD-10-CM | POA: Insufficient documentation

## 2023-04-26 DIAGNOSIS — R45851 Suicidal ideations: Secondary | ICD-10-CM | POA: Diagnosis not present

## 2023-04-26 DIAGNOSIS — F909 Attention-deficit hyperactivity disorder, unspecified type: Secondary | ICD-10-CM | POA: Insufficient documentation

## 2023-04-26 DIAGNOSIS — Z79899 Other long term (current) drug therapy: Secondary | ICD-10-CM | POA: Insufficient documentation

## 2023-04-26 LAB — LIPID PANEL
Cholesterol: 231 mg/dL — ABNORMAL HIGH (ref 0–200)
HDL: 102 mg/dL (ref >40)
LDL Cholesterol: 98 mg/dL (ref 0–99)
Total CHOL/HDL Ratio: 2.3 RATIO
Triglycerides: 156 mg/dL — ABNORMAL HIGH (ref <150)
VLDL: 31 mg/dL (ref 0–40)

## 2023-04-26 LAB — CBC WITH DIFFERENTIAL/PLATELET
Abs Immature Granulocytes: 0.02 10*3/uL (ref 0.00–0.07)
Basophils Absolute: 0.1 10*3/uL (ref 0.0–0.1)
Basophils Relative: 1 %
Eosinophils Absolute: 0 10*3/uL (ref 0.0–0.5)
Eosinophils Relative: 1 %
HCT: 40.8 % (ref 36.0–46.0)
Hemoglobin: 14.6 g/dL (ref 12.0–15.0)
Immature Granulocytes: 0 %
Lymphocytes Relative: 31 %
Lymphs Abs: 1.8 10*3/uL (ref 0.7–4.0)
MCH: 32 pg (ref 26.0–34.0)
MCHC: 35.8 g/dL (ref 30.0–36.0)
MCV: 89.5 fL (ref 80.0–100.0)
Monocytes Absolute: 0.5 10*3/uL (ref 0.1–1.0)
Monocytes Relative: 8 %
Neutro Abs: 3.4 10*3/uL (ref 1.7–7.7)
Neutrophils Relative %: 59 %
Platelets: 236 10*3/uL (ref 150–400)
RBC: 4.56 MIL/uL (ref 3.87–5.11)
RDW: 17.3 % — ABNORMAL HIGH (ref 11.5–15.5)
WBC: 5.8 10*3/uL (ref 4.0–10.5)
nRBC: 0 % (ref 0.0–0.2)

## 2023-04-26 LAB — COMPREHENSIVE METABOLIC PANEL
ALT: 36 U/L (ref 0–44)
AST: 52 U/L — ABNORMAL HIGH (ref 15–41)
Albumin: 3.8 g/dL (ref 3.5–5.0)
Alkaline Phosphatase: 64 U/L (ref 38–126)
Anion gap: 14 (ref 5–15)
BUN: 9 mg/dL (ref 6–20)
CO2: 21 mmol/L — ABNORMAL LOW (ref 22–32)
Calcium: 8.7 mg/dL — ABNORMAL LOW (ref 8.9–10.3)
Chloride: 100 mmol/L (ref 98–111)
Creatinine, Ser: 0.91 mg/dL (ref 0.44–1.00)
GFR, Estimated: >60 mL/min (ref >60)
Glucose, Bld: 97 mg/dL (ref 70–99)
Potassium: 3.8 mmol/L (ref 3.5–5.1)
Sodium: 135 mmol/L (ref 135–145)
Total Bilirubin: 0.4 mg/dL (ref 0.3–1.2)
Total Protein: 7.4 g/dL (ref 6.5–8.1)

## 2023-04-26 LAB — POCT URINE DRUG SCREEN - MANUAL ENTRY (I-SCREEN)
POC Amphetamine UR: POSITIVE — AB
POC Buprenorphine (BUP): NOT DETECTED
POC Cocaine UR: NOT DETECTED
POC Marijuana UR: NOT DETECTED
POC Methadone UR: NOT DETECTED
POC Methamphetamine UR: NOT DETECTED
POC Morphine: NOT DETECTED
POC Oxazepam (BZO): POSITIVE — AB
POC Oxycodone UR: NOT DETECTED
POC Secobarbital (BAR): NOT DETECTED

## 2023-04-26 LAB — POCT PREGNANCY, URINE: Preg Test, Ur: NEGATIVE

## 2023-04-26 LAB — HEMOGLOBIN A1C
Hgb A1c MFr Bld: 5.2 % (ref 4.8–5.6)
Mean Plasma Glucose: 102.54 mg/dL

## 2023-04-26 LAB — TSH: TSH: 3.477 u[IU]/mL (ref 0.350–4.500)

## 2023-04-26 LAB — POC URINE PREG, ED: Preg Test, Ur: NEGATIVE

## 2023-04-26 MED ORDER — ACETAMINOPHEN 325 MG PO TABS: 650.0000 mg | ORAL_TABLET | Freq: Four times a day (QID) | ORAL | Status: DC | PRN

## 2023-04-26 MED ORDER — ALUM & MAG HYDROXIDE-SIMETH 200-200-20 MG/5ML PO SUSP: 30.0000 mL | ORAL | Status: DC | PRN

## 2023-04-26 MED ORDER — SUMATRIPTAN SUCCINATE 25 MG PO TABS
25.0000 mg | ORAL_TABLET | Freq: Once | ORAL | Status: AC
  Administered 2023-04-26: 25 mg via ORAL
  Filled 2023-04-26: qty 1

## 2023-04-26 MED ORDER — MAGNESIUM HYDROXIDE 400 MG/5ML PO SUSP: 30.0000 mL | Freq: Every day | ORAL | Status: DC | PRN

## 2023-04-26 NOTE — ED Notes (Addendum)
Remains asleep at this time with no signs of distress or discomfort, respirations are easy skin color is appropriate for her ethnicity.

## 2023-04-26 NOTE — ED Provider Notes (Signed)
FBC/OBS ASAP Discharge Summary  Date and Time: 04/26/2023 8:51 AM  Name: Taylor [name]  MRN:  [id number]   Discharge Diagnoses:  Final diagnoses:  Suicidal ideation    Subjective: Patient states "I have never felt suicidal before last night, this was a single occurrence, I do not feel this way all the time.  I just had a lot on my plate.  I am feeling better, I am ready to go home now.  Now that I think about it it was silly to say, I have never felt that way (suicidal)."  Patient is assessed by this nurse practitioner face-to-face.  She is reclined in observation area upon my approach, no apparent distress.  She is alert and oriented, pleasant and cooperative during assessment.  She presents with euthymic mood, congruent affect.  Chart reviewed and patient discussed with Dr. Nelly Rout on 04/26/2023.  Seraphina denies suicidal and homicidal ideations.  She easily contracts verbally for safety at this time.  She denies history of suicide attempts, denies history of nonsuicidal self-harm behavior.  She denies auditory and visual hallucinations.  There is no evidence of delusional thought content and no indication that patient is responding to internal stimuli.  She denies symptoms of paranoia.  Patient reports recent stressors include a divorce and disputed custody arrangement for her son.  She has recently attended court and has court dates upcoming related to custody of son.  Graclyn has been diagnosed with attention deficit disorder, ADHD, Adderall use disorder, alcohol abuse, anxiety, major depressive disorder, PMDD.  Her current medication regimen includes Adderall and alprazolam, these medications are prescribed by primary care provider, Dr. Benedetto Goad.  She has recently been linked with individual counseling, Rosilyn Mings.  Patient reports she is seeking alternative individual counseling provider.  She states that she and Bradley Ferris have decided that patient will no longer follow with current  individual counseling provider.  She denies history of inpatient psychiatric treatment.  She reports family mental health history includes her father who has been diagnosed with major depressive disorder.  She is compliant with current medications.  She reports she has a history of alcohol use disorder.  She "identifies an alcoholic but I have not had an issue with drinking for approximately 10 years"   She reports sober for 1 year prior to relapse on alcohol 2 weeks ago.  She has joined several residential substance use programs.  Most recently at [facility] 1 year ago.  She was briefly linked with [facility] after [facility] from [facility].  Discussed potentially joining IOP or PHP programs at [facility], patient will consider.  She would prefer to be linked with individual counseling.  Reviewed potential psychiatrist for medication management, patient declines.  She would prefer her medications be managed by current primary care provider.  Patient is aware alcohol should not be used when taking alprazolam.  Discussed potential side effects.  She will consider weaning off of her alprazolam after speaking with current prescriber/primary care provider.  Chinenye resides in [place] with her mother and 23-year-old son.  She denies access to weapons.  She is employed.  She endorses average sleep and appetite.  She denies substance use aside from alcohol.  She attends alcoholic Anonymous meetings intermittently and does have an AA sponsor.  Patient offered support and encouragement.  She gives verbal consent to speak with her mother, Nickelle Barman phone number [phone].  Spoke with patient's mother who shared that patient's mother and father have  concern that patient may be misusing her prescription medications including Adderall.  No safety concerns reported.  Patient's mother verbalizes understanding of strict return precautions.   Patient and family are educated and  verbalize understanding of mental health resources and other crisis services in the community. They are instructed to call 911 and present to the nearest emergency room should patient experience any suicidal/homicidal ideation, auditory/visual/hallucinations, or detrimental worsening of mental health condition.      Stay Summary: 04/26/2023- 0055am HPI: Taylor [name] is a 42 year old female with past psychiatric history of ADD, ADHD, PMDD, ADHD, polysubstance abuse, GAD, and depression, who presented voluntarily as a walk-in to [facility] accompanied by her mother with complaints of suicidal ideations, no plan or intent.   Patient was seen face-to-face by this provider and chart reviewed.   On approach, patient is seated, calm with flat affect.   Patient reports "I'm here because I just felt so emotionally drained and I'm having like some sort of suicidal tendencies or thoughts, like overwhelming feelings, like I'm thinking I would be better off if I'm not here". Patient denies having any plans or intent. Patient reports she took a xanax for anxiety before her visit to the [facility].    Patient reports her suicidal ideations started in the last 12 hours and was triggered by concerns for her son and access to his dad, because the family recently had a court hearing on the 26th and the judge is letting her son's father see him and be with him more and the situation is causing a lot of distress and rift within her family.   Patient reports, "tonight I was thinking to myself like, I will be so much better if I'm not here, because having him being with his dad is distressing to me, but my child will be devastated if I'm not here. My best friend committed suicide 10 years ago and I imagine he is at peace, and if I were to do anything, I'll do it in a peaceful manner, but its hard to say whether this is a realistic thought or not".    Patient reports "tonight, I'm thinking all about all this stress, that I  would be better off if I'm not here and I told my mom and that's when she decided we should come here".    Patient denies HI/AVH or paranoia. Patient reports she lives with her mom, son and occasionally her brother. Patient denies access to a gun or weapon. Patient reports her sleep is poor and appetite as in between.  Patient reports she is established with outpatient psychiatric services, and in the process of seeking a new mental health provider. Patient reports she is a recovering alcoholic, currently in a substance abuse recovery program. Patient denies substance use.    Patient reports she is prescribed Adderall for ADHD, prn Xanax for anxiety and insomnia, prn Imitrex for migraines, and generic birth control pill for PMDD.   Support, encouragement, reassurance provided about ongoing stressors.  Patient is provided with opportunity for questions.   On evaluation, patient is alert, oriented x 4, and cooperative. Speech is clear and coherent. Pt appears casual. Eye contact is fair. Mood is depressed, affect is flat and congruent with mood. Thought process is coherent and thought content is WDL. Pt endorses passive SI, denies HI/AVH or paranoia. There is no objective indication that the patient is responding to internal stimuli. No delusions elicited during this assessment.     Discussed recommendation for  admission to the continuous observation unit overnight for safety monitoring and re-eval for SI/HI/AVH or paranoia in am. Patient is in agreement.   Total Time spent with patient: 30 minutes  Past Psychiatric History: see above Past Medical History: see H&P Family History: none reported Family Psychiatric History: father- MDD Social History: resides with family, employed, alcohol use Tobacco Cessation:  N/A, patient does not currently use tobacco products  Current Medications:  Current Facility-Administered Medications  Medication Dose Route Frequency Provider Last Rate Last Admin    acetaminophen (TYLENOL) tablet 650 mg  650 mg Oral Q6H PRN Onuoha, Chinwendu V, NP       alum & mag hydroxide-simeth (MAALOX/MYLANTA) 200-200-20 MG/5ML suspension 30 mL  30 mL Oral Q4H PRN Onuoha, Chinwendu V, NP       magnesium hydroxide (MILK OF MAGNESIA) suspension 30 mL  30 mL Oral Daily PRN Onuoha, Chinwendu V, NP       Current Outpatient Medications  Medication Sig Dispense Refill   EPINEPHrine 0.3 mg/0.3 mL IJ SOAJ injection Inject 0.3 mg into the muscle as needed for anaphylaxis.     ondansetron (ZOFRAN-ODT) 4 MG disintegrating tablet Take 4 mg by mouth every 6 (six) hours as needed for nausea.     SUMAtriptan (IMITREX) 100 MG tablet Take 100 mg by mouth every 2 (two) hours as needed (Take at the earliest sign of migraine).     ALPRAZolam (XANAX) 0.25 MG tablet Take 0.25-0.5 mg by mouth at bedtime as needed for sleep.     amphetamine-dextroamphetamine (ADDERALL) 30 MG tablet Take 20 mg by mouth 2 (two) times daily.     buPROPion (WELLBUTRIN XL) 300 MG 24 hr tablet Take 1 tablet (300 mg total) by mouth daily after breakfast. 90 tablet 1   drospirenone-ethinyl estradiol (YAZ) 3-0.02 MG tablet Take 1 tablet by mouth daily. Take daily for 3 packs skipping placebo tablets then skip for one week and repeat the process.      PTA Medications:  Facility Ordered Medications  Medication   acetaminophen (TYLENOL) tablet 650 mg   alum & mag hydroxide-simeth (MAALOX/MYLANTA) 200-200-20 MG/5ML suspension 30 mL   magnesium hydroxide (MILK OF MAGNESIA) suspension 30 mL   [COMPLETED] SUMAtriptan (IMITREX) tablet 25 mg   PTA Medications  Medication Sig   SUMAtriptan (IMITREX) 100 MG tablet Take 100 mg by mouth every 2 (two) hours as needed (Take at the earliest sign of migraine).   EPINEPHrine 0.3 mg/0.3 mL IJ SOAJ injection Inject 0.3 mg into the muscle as needed for anaphylaxis.   ondansetron (ZOFRAN-ODT) 4 MG disintegrating tablet Take 4 mg by mouth every 6 (six) hours as needed for nausea.    drospirenone-ethinyl estradiol (YAZ) 3-0.02 MG tablet Take 1 tablet by mouth daily. Take daily for 3 packs skipping placebo tablets then skip for one week and repeat the process.   buPROPion (WELLBUTRIN XL) 300 MG 24 hr tablet Take 1 tablet (300 mg total) by mouth daily after breakfast.   ALPRAZolam (XANAX) 0.25 MG tablet Take 0.25-0.5 mg by mouth at bedtime as needed for sleep.   amphetamine-dextroamphetamine (ADDERALL) 30 MG tablet Take 20 mg by mouth 2 (two) times daily.       01/30/2017   11:37 AM 05/18/2016    3:02 PM 03/02/2016   11:17 AM  Depression screen PHQ 2/9  Decreased Interest 0 0 0  Down, Depressed, Hopeless 0 0 0  PHQ - 2 Score 0 0 0    Flowsheet Row ED from 04/26/2023 in [facility] ED from 04/25/2023 in [facility] Emergency Department at [facility] ED from 03/29/2023 in [facility] Emergency Department at [facility]  C-SSRS RISK CATEGORY Low Risk No Risk No Risk       Musculoskeletal  Strength & Muscle Tone: within normal limits Gait & Station: normal Patient leans: N/A  Psychiatric Specialty Exam  Presentation  General Appearance:  Appropriate for Environment; Casual  Eye Contact: Good  Speech: Clear and Coherent; Normal Rate  Speech Volume: Normal  Handedness: Right   Mood and Affect  Mood: Euthymic  Affect: Appropriate; Congruent   Thought Process  Thought Processes: Coherent; Goal Directed; Linear  Descriptions of Associations:Intact  Orientation:Full (Time, Place and Person)  Thought Content:Logical; WDL  Diagnosis of Schizophrenia or Schizoaffective disorder in past: No    Hallucinations:Hallucinations: None  Ideas of Reference:None  Suicidal Thoughts:Suicidal Thoughts: No SI Passive Intent and/or Plan: Without Plan; Without Intent  Homicidal Thoughts:Homicidal Thoughts: No   Sensorium  Memory: Immediate Good; Recent Good  Judgment: Good  Insight: Fair   Executive  Functions  Concentration: Good  Attention Span: Good  Recall: Good  Fund of Knowledge: Good  Language: Good   Psychomotor Activity  Psychomotor Activity: Psychomotor Activity: Normal   Assets  Assets: Communication Skills; Desire for Improvement; Financial Resources/Insurance; Housing; Leisure Time; Physical Health; Resilience; Social Support   Sleep  Sleep: Sleep: Fair   Nutritional Assessment (For OBS and FBC admissions only) Has the patient had a weight loss or gain of 10 pounds or more in the last 3 months?: No Has the patient had a decrease in food intake/or appetite?: No Does the patient have dental problems?: No Does the patient have eating habits or behaviors that may be indicators of an eating disorder including binging or inducing vomiting?: No Has the patient recently lost weight without trying?: 0 Has the patient been eating poorly because of a decreased appetite?: 0 Malnutrition Screening Tool Score: 0    Physical Exam  Physical Exam Vitals and nursing note reviewed.  Constitutional:      Appearance: Normal appearance. She is well-developed.  HENT:     Head: Normocephalic and atraumatic.     Nose: Nose normal.  Cardiovascular:     Rate and Rhythm: Normal rate.  Pulmonary:     Effort: Pulmonary effort is normal.  Musculoskeletal:        General: Normal range of motion.     Cervical back: Normal range of motion.  Skin:    General: Skin is warm and dry.  Neurological:     Mental Status: She is alert and oriented to person, place, and time.  Psychiatric:        Attention and Perception: Attention and perception normal.        Mood and Affect: Mood and affect normal.        Speech: Speech normal.        Behavior: Behavior normal. Behavior is cooperative.        Thought Content: Thought content normal.        Cognition and Memory: Cognition and memory normal.        Judgment: Judgment normal.    Review of Systems  Constitutional:  Negative.   HENT: Negative.    Eyes: Negative.   Respiratory: Negative.    Cardiovascular: Negative.   Gastrointestinal: Negative.   Genitourinary: Negative.   Musculoskeletal: Negative.   Skin: Negative.   Neurological: Negative.   Psychiatric/Behavioral:  Positive for substance abuse.  Blood pressure 128/83, pulse 99, temperature 97.8 F (36.6 C), temperature source Tympanic, resp. rate 18, last menstrual period 12/19/2022, SpO2 100 %, unknown if currently breastfeeding. There is no height or weight on file to calculate BMI.  Demographic Factors:  Divorced or widowed and Caucasian  Loss Factors: NA  Historical Factors: NA  Risk Reduction Factors:   Responsible for children under 74 years of age, Sense of responsibility to family, Employed, Living with another person, especially a relative, Positive social support, Positive therapeutic relationship, and Positive coping skills or problem solving skills  Continued Clinical Symptoms:  Alcohol/Substance Abuse/Dependencies Previous Psychiatric Diagnoses and Treatments  Cognitive Features That Contribute To Risk:  None    Suicide Risk:  Minimal: No identifiable suicidal ideation.  Patients presenting with no risk factors but with morbid ruminations; may be classified as minimal risk based on the severity of the depressive symptoms  Plan Of Care/Follow-up recommendations:  Follow-up with outpatient mental health resources provided. Medications: -Albuterol 108 mcg/ACT inhaler inhale 2 puffs every 6 hours as needed/wheezing or shortness of breath -Alprazolam 0.25 mg - 0.5 mg nightly as needed/sleep -Amphetamine-dextroamphetamine 20 mg twice daily -Bupropion 300 mg daily -drospirenone-ethinyl estradiol 3-0.003mg  daily -Epinephrine 0.3 mg into the muscle as needed/anaphylaxis -Ondansetron 4 mg every 6 hours as needed/nausea -Polyvinyl alcohol 1 point 4 Percent Ophthalmic Solution Place 2 drops into both eyes as  needed/dry -Sumatriptan 100 mg every 2 hours as needed/migraine  Disposition: Discharge  Lenard Lance, FNP 04/26/2023, 8:51 AM

## 2023-04-26 NOTE — ED Provider Notes (Signed)
[facility] Continuous Assessment Admission H&P  Date: 04/26/23 Patient Name: Taylor [name] MRN: [id number] Chief Complaint: "I'm having some sort of suicidal tendencies or thoughts"  Diagnoses:  Final diagnoses:  Suicidal ideation    HPI: Yewande Peyer is a 42 year old female with past psychiatric history of ADD, ADHD, PMDD, ADHD, polysubstance abuse, GAD, and depression, who presented voluntarily as a walk-in to [facility] accompanied by her mother with complaints of suicidal ideations, no plan or intent.  Patient was seen face-to-face by this provider and chart reviewed.  On approach, patient is seated, calm with flat affect.  Patient reports "I'm here because I just felt so emotionally drained and I'm having like some sort of suicidal tendencies or thoughts, like overwhelming feelings, like I'm thinking I would be better off if I'm not here". Patient denies having any plans or intent. Patient reports she took a xanax for anxiety before her visit to the [facility].   Patient reports her suicidal ideations started in the last 12 hours and was triggered by concerns for her son and access to his dad, because the family recently had a court hearing on the 26th and the judge is letting her son's father see him and be with him more and the situation is causing a lot of distress and rift within her family.  Patient reports, "tonight I was thinking to myself like, I will be so much better if I'm not here, because having him being with his dad is distressing to me, but my child will be devastated if I'm not here. My best friend committed suicide 10 years ago and I imagine he is at peace, and if I were to do anything, I'll do it in a peaceful manner, but its hard to say whether this is a realistic thought or not".   Patient reports "tonight, I'm thinking all about all this stress, that I would be better off if I'm not here and I told my mom and that's when she decided we should come here".    Patient denies HI/AVH or paranoia. Patient reports she lives with her mom, son and occasionally her brother. Patient denies access to a gun or weapon. Patient reports her sleep is poor and appetite as in between.  Patient reports she is established with outpatient psychiatric services, and in the process of seeking a new mental health provider. Patient reports she is a recovering alcoholic, currently in a substance abuse recovery program. Patient denies substance use.   Patient reports she is prescribed Adderall for ADHD, prn Xanax for anxiety and insomnia, prn Imitrex for migraines, and generic birth control pill for PMDD.  Support, encouragement, reassurance provided about ongoing stressors.  Patient is provided with opportunity for questions.  On evaluation, patient is alert, oriented x 4, and cooperative. Speech is clear and coherent. Pt appears casual. Eye contact is fair. Mood is depressed, affect is flat and congruent with mood. Thought process is coherent and thought content is WDL. Pt endorses passive SI, denies HI/AVH or paranoia. There is no objective indication that the patient is responding to internal stimuli. No delusions elicited during this assessment.    Discussed recommendation for admission to the continuous observation unit overnight for safety monitoring and re-eval for SI/HI/AVH or paranoia in am. Patient is in agreement.   Total Time spent with patient: 20 minutes  Musculoskeletal  Strength & Muscle Tone: within normal limits Gait & Station: normal Patient leans: N/A  Psychiatric Specialty Exam  Presentation General  Appearance:  Casual  Eye Contact: Fair  Speech: Clear and Coherent  Speech Volume: Normal  Handedness: Right   Mood and Affect  Mood: Depressed  Affect: Congruent   Thought Process  Thought Processes: Coherent  Descriptions of Associations:Intact  Orientation:Full (Time, Place and Person)  Thought Content:WDL     Hallucinations:Hallucinations: None  Ideas of Reference:None  Suicidal Thoughts:Suicidal Thoughts: Yes, Passive SI Passive Intent and/or Plan: Without Plan; Without Intent  Homicidal Thoughts:Homicidal Thoughts: No   Sensorium  Memory: Immediate Good  Judgment: Fair  Insight: Fair   Executive Functions  Concentration: Good  Attention Span: Good  Recall: Good  Fund of Knowledge: Good  Language: Good   Psychomotor Activity  Psychomotor Activity: Psychomotor Activity: Normal   Assets  Assets: Communication Skills; Desire for Improvement; Social Support; Resilience   Sleep  Sleep: Sleep: Poor   Nutritional Assessment (For OBS and FBC admissions only) Has the patient had a weight loss or gain of 10 pounds or more in the last 3 months?: No Has the patient had a decrease in food intake/or appetite?: No Does the patient have dental problems?: No Does the patient have eating habits or behaviors that may be indicators of an eating disorder including binging or inducing vomiting?: No Has the patient recently lost weight without trying?: 0 Has the patient been eating poorly because of a decreased appetite?: 0 Malnutrition Screening Tool Score: 0    Physical Exam Constitutional:      General: She is not in acute distress.    Appearance: She is not diaphoretic.  HENT:     Head: Normocephalic.     Right Ear: External ear normal.     Left Ear: External ear normal.     Nose: No congestion.  Eyes:     General:        Right eye: No discharge.        Left eye: No discharge.  Pulmonary:     Effort: No respiratory distress.  Chest:     Chest wall: No tenderness.  Neurological:     Mental Status: She is alert and oriented to person, place, and time.  Psychiatric:        Attention and Perception: Attention and perception normal.        Mood and Affect: Mood is depressed. Affect is flat.        Speech: Speech normal.        Behavior: Behavior is  cooperative.        Thought Content: Thought content is not paranoid or delusional. Thought content includes suicidal ideation. Thought content does not include homicidal ideation. Thought content does not include homicidal or suicidal plan.        Cognition and Memory: Cognition and memory normal.    Review of Systems  Constitutional:  Negative for chills, diaphoresis and fever.  HENT:  Negative for congestion.   Eyes:  Negative for discharge.  Respiratory:  Negative for cough, shortness of breath and wheezing.   Cardiovascular:  Negative for chest pain and palpitations.  Gastrointestinal:  Negative for diarrhea, nausea and vomiting.  Neurological:  Positive for headaches. Negative for dizziness, seizures and loss of consciousness.  Psychiatric/Behavioral:  Positive for depression and suicidal ideas.     Blood pressure (!) 140/103, pulse (!) 101, temperature 98.3 F (36.8 C), temperature source Oral, resp. rate 20, last menstrual period 12/19/2022, SpO2 100 %, unknown if currently breastfeeding. There is no height or weight on file to calculate BMI.  Past  Psychiatric History: See H & P   Is the patient at risk to self? Yes  Has the patient been a risk to self in the past 6 months? No .    Has the patient been a risk to self within the distant past? Yes   Is the patient a risk to others? No   Has the patient been a risk to others in the past 6 months? No   Has the patient been a risk to others within the distant past? No   Past Medical History: See Chart  Family History: N/A  Social History: N/A  Last Labs:  No visits with results within 6 Month(s) from this visit.  Latest known visit with results is:  Admission on 06/30/2022, Discharged on 06/30/2022  Component Date Value Ref Range Status   Sodium 06/30/2022 137  135 - 145 mmol/L Final   Potassium 06/30/2022 3.4 (L)  3.5 - 5.1 mmol/L Final   Chloride 06/30/2022 103  98 - 111 mmol/L Final   CO2 06/30/2022 20 (L)  22 - 32  mmol/L Final   Glucose, Bld 06/30/2022 100 (H)  70 - 99 mg/dL Final   Glucose reference range applies only to samples taken after fasting for at least 8 hours.   BUN 06/30/2022 5 (L)  6 - 20 mg/dL Final   Creatinine, Ser 06/30/2022 0.91  0.44 - 1.00 mg/dL Final   Calcium 16/09/9603 9.1  8.9 - 10.3 mg/dL Final   Total Protein 54/08/8118 8.1  6.5 - 8.1 g/dL Final   Albumin 14/78/2956 4.4  3.5 - 5.0 g/dL Final   AST 21/30/8657 13 (L)  15 - 41 U/L Final   ALT 06/30/2022 10  0 - 44 U/L Final   Alkaline Phosphatase 06/30/2022 42  38 - 126 U/L Final   Total Bilirubin 06/30/2022 0.2 (L)  0.3 - 1.2 mg/dL Final   GFR, Estimated 06/30/2022 >60  >60 mL/min Final   Comment: (NOTE) Calculated using the CKD-EPI Creatinine Equation (2021)    Anion gap 06/30/2022 14  5 - 15 Final   Performed at [facility], [address] [place], [place] [address]   Alcohol, Ethyl (B) 06/30/2022 <10  <10 mg/dL Final   Comment: (NOTE) Lowest detectable limit for serum alcohol is 10 mg/dL.  For medical purposes only. Performed at [facility], [address] [place], [place] [address]    WBC 06/30/2022 10.6 (H)  4.0 - 10.5 K/uL Final   RBC 06/30/2022 4.68  3.87 - 5.11 MIL/uL Final   Hemoglobin 06/30/2022 14.5  12.0 - 15.0 g/dL Final   HCT 41/32/4401 42.6  36.0 - 46.0 % Final   MCV 06/30/2022 91.0  80.0 - 100.0 fL Final   MCH 06/30/2022 31.0  26.0 - 34.0 pg Final   MCHC 06/30/2022 34.0  30.0 - 36.0 g/dL Final   RDW 02/72/5366 16.1 (H)  11.5 - 15.5 % Final   Platelets 06/30/2022 216  150 - 400 K/uL Final   nRBC 06/30/2022 0.0  0.0 - 0.2 % Final   Performed at [facility], [address], [place], [place] [address]   Opiates 06/30/2022 NONE DETECTED  NONE DETECTED Final   Cocaine 06/30/2022 NONE DETECTED  NONE DETECTED Final   Benzodiazepines 06/30/2022 POSITIVE (A)  NONE DETECTED Final   Amphetamines 06/30/2022 NONE DETECTED  NONE DETECTED Final    Tetrahydrocannabinol 06/30/2022 NONE DETECTED  NONE DETECTED Final   Barbiturates 06/30/2022 NONE DETECTED  NONE DETECTED Final  Comment: (NOTE) DRUG SCREEN FOR MEDICAL PURPOSES ONLY.  IF CONFIRMATION IS NEEDED FOR ANY PURPOSE, NOTIFY LAB WITHIN 5 DAYS.  LOWEST DETECTABLE LIMITS FOR URINE DRUG SCREEN Drug Class                     Cutoff (ng/mL) Amphetamine and metabolites    1000 Barbiturate and metabolites    200 Benzodiazepine                 200 Tricyclics and metabolites     300 Opiates and metabolites        300 Cocaine and metabolites        300 THC                            50 Performed at [facility], [address] [place], [place] [address]    Preg Test, Ur 06/30/2022 NEGATIVE  NEGATIVE Final   Comment:        THE SENSITIVITY OF THIS METHODOLOGY IS >20 mIU/mL. Performed at [facility], [address], [place], [place] [address]     Allergies: Bee venom  Medications:  Facility Ordered Medications  Medication   acetaminophen (TYLENOL) tablet 650 mg   alum & mag hydroxide-simeth (MAALOX/MYLANTA) 200-200-20 MG/5ML suspension 30 mL   magnesium hydroxide (MILK OF MAGNESIA) suspension 30 mL   SUMAtriptan (IMITREX) tablet 25 mg   PTA Medications  Medication Sig   drospirenone-ethinyl estradiol (YAZ) 3-0.02 MG tablet Take 1 tablet by mouth every evening.   buPROPion (WELLBUTRIN XL) 300 MG 24 hr tablet Take 1 tablet (300 mg total) by mouth daily after breakfast.   ALPRAZolam (XANAX) 0.25 MG tablet Take 0.5 mg by mouth at bedtime as needed for sleep.   amphetamine-dextroamphetamine (ADDERALL) 30 MG tablet Take 20 mg by mouth in the morning and at bedtime. Take 2/3 tablet (20 mg) BID   ondansetron (ZOFRAN ODT) 8 MG disintegrating tablet Take 1 tablet (8 mg total) by mouth every 8 (eight) hours as needed for nausea or vomiting.   pantoprazole (PROTONIX) 40 MG tablet Take 40 mg by mouth daily.   dicyclomine (BENTYL) 20 MG tablet  Take 1 tablet (20 mg total) by mouth 2 (two) times daily.   chlordiazePOXIDE (LIBRIUM) 25 MG capsule 25 mg PO TID x 1 day, then 25 mg PO BID X 1 day, then 25 mg PO QD X 1 day      Medical Decision Making  Recommend admission to the continuous observation unit overnight for safety monitoring and re-eval in am for SI/HI/AVH or paranoia. Patient is unable to contract for safety.  Lab Orders         CBC with Differential/Platelet         Comprehensive metabolic panel         Hemoglobin A1c         Lipid panel         TSH         POC urine preg, ED         POCT Urine Drug Screen - (I-Screen)      Prn meds -Imitrex 25 mg PO once for migraines -Tylenol 650 mg p.o. every 6 hours as needed pain -Maalox 30 ml p.o. every 4 hours as needed indigestion -MOM 30 ml p.o. daily as needed constipation    Recommendations  Based on my evaluation the patient does not appear to have an emergency medical condition.  Recommend admission to the continuous observation unit overnight for safety monitoring and re-eval in am for SI/HI/AVH or paranoia.  Mancel Bale, NP 04/26/23  1:34 AM

## 2023-04-26 NOTE — ED Notes (Signed)
Patient calm and cooperative this shift. Resting in bed, no acute distress.

## 2023-04-26 NOTE — Discharge Instructions (Addendum)

## 2023-04-26 NOTE — BH Assessment (Signed)
Comprehensive Clinical Assessment (CCA) Note   04/26/2023 Taylor [name] [id number]    Disposition: Rockney Ghee, NP recommend admission to the continuous observation unit overnight for safety monitoring and re-eval in am for SI/HI/AVH or paranoia.     The patient demonstrates the following risk factors for suicide: Chronic risk factors for suicide include: substance use disorder. Acute risk factors for suicide include: social withdrawal/isolation. Protective factors for this patient include: positive social support. Considering these factors, the overall suicide risk at this point appears to be low. Patient is appropriate for outpatient follow up.       Chief Complaint:  Chief Complaint  Patient presents with   Stress   suicidal ideation   Visit Diagnosis:  Polysubstance Abuse  ADHD Generalized Anxiety Disorder      Pt presents to [facility] voluntarily, accompanied by her mother with complaint of suicidal ideations, with no plan or intent. Pt reports that she is highly stressed and that family issues are her immediate stressors. Pt reports having ongoing thoughts of suicide for a few days, but today she was afraid that she might go through with it. Pt has history of alcohol abuse and depression. Pt is unsure if she can contract for safety. Pt denies HI, AVH and substance/alcohol use.   Pt is dressed unremarkably, alert, oriented x4 with normal speech and normal motor behavior. Eye contact is good. Pt's mood is depressed, and affect is anxious. Thought process is coherent and relevant. Pt's insight is fair and judgement is poor. There is no indication Pt is not currently responding to internal stimuli or experiencing delusional thought content. Pt was cooperative throughout assessment    CCA Screening, Triage and Referral (STR)  Patient Reported Information How did you hear about [place]? Family/Friend  What Is the Reason for Your Visit/Call Today?  Pt presents to [facility]  voluntarily, accompanied by her mother with complaint of suicidal ideations, with no plan or intent. Pt reports that she is highly stressed and that family issues are her immediate stressors. Pt reports having ongoing thoughts of suicide for a few days, but today she was afraid that she might go through with it. Pt has history of alcohol abuse and depression. Pt is unsure if she can contract for safety. Pt denies HI, AVH and substance/alcohol use. How Long Has This Been Causing You Problems? <Week  What Do You Feel Would Help You the Most Today? Treatment for Depression or other mood problem   Have You Recently Had Any Thoughts About Hurting Yourself? Yes  Are You Planning to Commit Suicide/Harm Yourself At This time? No   Flowsheet Row ED from 04/26/2023 in [facility] ED from 04/25/2023 in [facility] Emergency Department at [facility] ED from 03/29/2023 in [facility] Emergency Department at [facility]  C-SSRS RISK CATEGORY Low Risk No Risk No Risk       Have you Recently Had Thoughts About Hurting Someone Karolee Ohs? No  Are You Planning to Harm Someone at This Time? No  Explanation: Pt denies HI   Have You Used Any Alcohol or Drugs in the Past 24 Hours? No  What Did You Use and How Much? Pt reports hx of etoh abuse but denies use within 24 hours.   Do You Currently Have a Therapist/Psychiatrist? No  Name of Therapist/Psychiatrist: Name of Therapist/Psychiatrist: Pt denies having any outpatient services.   Have You Been Recently Discharged From Any Office Practice or Programs? No  Explanation of Discharge From Practice/Program: n/a  CCA Screening Triage Referral Assessment Type of Contact: Face-to-Face  Telemedicine Service Delivery:   Is this Initial or Reassessment?   Date Telepsych consult ordered in [facility]:    Time Telepsych consult ordered in [facility]:    Location of Assessment: [facility] Assessment Services  Provider Location: [facility] Assessment Services   Collateral Involvement: None   Does Patient Have a Automotive engineer Guardian? No  Legal Guardian Contact Information: n/a  Copy of Legal Guardianship Form: -- (n/a)  Legal Guardian Notified of Arrival: -- (n/a)  Legal Guardian Notified of Pending Discharge: -- (n/a)  If Minor and Not Living with Parent(s), Who has Custody? n/a  Is CPS involved or ever been involved? Never  Is APS involved or ever been involved? Never   Patient Determined To Be At Risk for Harm To Self or Others Based on Review of Patient Reported Information or Presenting Complaint? Yes, for Self-Harm  Method: Plan without intent  Availability of Means: No access or NA  Intent: Vague intent or NA  Notification Required: No need or identified person  Additional Information for Danger to Others Potential: -- (n/a)  Additional Comments for Danger to Others Potential: n/a  Are There Guns or Other Weapons in Your Home? No  Types of Guns/Weapons: Pt denies access to guns/ weapons  Are These Weapons Safely Secured?                            Yes  Who Could Verify You Are Able To Have These Secured: Pt denies access to guns/ weapons  Do You Have any Outstanding Charges, Pending Court Dates, Parole/Probation? Pt reports being court battel with child's father. Pt denies any legal concerns at this time.  Contacted To Inform of Risk of Harm To Self or Others: -- (n/a)    Does Patient Present under Involuntary Commitment? No    [place] of Residence: [place]   Patient Currently Receiving the Following Services: Not Receiving Services   Determination of Need: Urgent (48 hours)   Options For Referral: Other: Comment; BH Urgent Care; Outpatient Therapy; Medication Management     CCA Biopsychosocial Patient Reported Schizophrenia/Schizoaffective Diagnosis in Past: No   Strengths: Pt is willing to seek treatment   Mental Health Symptoms Depression:   Change in  energy/activity; Hopelessness; Increase/decrease in appetite; Sleep (too much or little)   Duration of Depressive symptoms:  Duration of Depressive Symptoms: Greater than two weeks   Mania:   None   Anxiety:    Worrying   Psychosis:   None   Duration of Psychotic symptoms:    Trauma:   None   Obsessions:   None   Compulsions:   None   Inattention:   None   Hyperactivity/Impulsivity:   None   Oppositional/Defiant Behaviors:   None   Emotional Irregularity:   Chronic feelings of emptiness; Recurrent suicidal behaviors/gestures/threats   Other Mood/Personality Symptoms:   NA    Mental Status Exam Appearance and self-care  Stature:   Average   Weight:   Average weight   Clothing:   Casual   Grooming:   Normal   Cosmetic use: none  Posture/gait:   Normal   Motor activity:   Not Remarkable   Sensorium  Attention:   Normal   Concentration:   Normal   Orientation:   Time; Situation; Person; Place   Recall/memory:   Normal   Affect and Mood  Affect:   Anxious;  Depressed   Mood:   Depressed; Anxious   Relating  Eye contact:   Normal   Facial expression:   Anxious; Depressed   Attitude toward examiner:   Cooperative   Thought and Language  Speech flow:  Clear and Coherent   Thought content:   Appropriate to Mood and Circumstances   Preoccupation:   None   Hallucinations:   None   Organization:   Coherent   [employer] of Knowledge:   Fair   Intelligence:   Average   Abstraction:   Curator; Normal   Judgement:   Fair   Dance movement psychotherapist:   Realistic   Insight:   Fair   Decision Making:   Normal   Social Functioning  Social Maturity:   Isolates   Social Judgement:   Normal   Stress  Stressors:   Transitions   Coping Ability:   Human resources officer Deficits:   Self-control; Self-care   Supports:   Family     Religion: Religion/Spirituality Are You A Religious Person?:  No How Might This Affect Treatment?: n/a  Leisure/Recreation: Leisure / Recreation Do You Have Hobbies?: Yes Leisure and Hobbies: Playing with son  Exercise/Diet: Exercise/Diet Do You Exercise?: No Have You Gained or Lost A Significant Amount of Weight in the Past Six Months?: No Do You Follow a Special Diet?: No Do You Have Any Trouble Sleeping?: Yes Explanation of Sleeping Difficulties: Pt reports getting 2-5 hours per night   CCA Employment/Education Employment/Work Situation: Employment / Work Situation Employment Situation: Employed Work Stressors: UTA Patient's Job has Been Impacted by Current Illness: No Has Patient ever Been in Equities trader?: No  Education: Education Is Patient Currently Attending School?: No Last Grade Completed: 12 Did You Product manager?: Yes What Type of College Degree Do you Have?: Law Degree Did You Have An Individualized Education Program (IIEP): No Did You Have Any Difficulty At School?: No Patient's Education Has Been Impacted by Current Illness: No   CCA Family/Childhood History Family and Relationship History: Family history Marital status: Single Does patient have children?: Yes How many children?: 1 How is patient's relationship with their children?: Pt reports a good relationship  Childhood History:  Childhood History By whom was/is the patient raised?: Mother Did patient suffer any verbal/emotional/physical/sexual abuse as a child?: No Did patient suffer from severe childhood neglect?: No Has patient ever been sexually abused/assaulted/raped as an adolescent or adult?: No Was the patient ever a victim of a crime or a disaster?: No Witnessed domestic violence?: No Has patient been affected by domestic violence as an adult?: No       CCA Substance Use Alcohol/Drug Use: Alcohol / Drug Use Pain Medications: Denies abuse Prescriptions: Denies abuse Over the Counter: Denies abuse History of alcohol / drug use?:  Yes Longest period of sobriety (when/how long): 90 days Negative Consequences of Use: Personal relationships, Work / Programmer, multimedia Withdrawal Symptoms: Tremors, Sweats, Nausea / Vomiting, Seizures Substance #1 Name of Substance 1: Etoh 1 - Age of First Use: Unknown 1 - Amount (size/oz): 3-4 12 oz beers 1 - Frequency: Daily 1 - Duration: n/a 1 - Last Use / Amount: Pt denies recent use 1 - Method of Aquiring: n/a 1- Route of Use: n/a                       ASAM's:  Six Dimensions of Multidimensional Assessment  Dimension 1:  Acute Intoxication and/or Withdrawal Potential:  Dimension 2:  Biomedical Conditions and Complications:      Dimension 3:  Emotional, Behavioral, or Cognitive Conditions and Complications:     Dimension 4:  Readiness to Change:     Dimension 5:  Relapse, Continued use, or Continued Problem Potential:     Dimension 6:  Recovery/Living Environment:     ASAM Severity Score:    ASAM Recommended Level of Treatment: ASAM Recommended Level of Treatment:  (Pt reports that she is currently part of a recovery program.)   Substance use Disorder (SUD) Substance Use Disorder (SUD)  Checklist Symptoms of Substance Use:  (Pt reports that she is currently part of a recovery program.)  Recommendations for Services/Supports/Treatments: Recommendations for Services/Supports/Treatments Recommendations For Services/Supports/Treatments: SAIOP (Substance Abuse Intensive Outpatient Program)  Discharge Disposition:    DSM5 Diagnoses: Patient Active Problem List   Diagnosis Date Noted   Thrombocytopenia (HCC) 12/27/2021   Neutropenia (HCC) 12/27/2021   Hypokalemia 12/27/2021   Hypomagnesemia 12/27/2021   Alcohol withdrawal (HCC) 12/26/2021   Delayed delivery after SROM (spontaneous rupture of membranes) 11/27/2017   SVD (spontaneous vaginal delivery) 11/27/2017   Postpartum care following vaginal delivery (12/10) 11/27/2017   Second-degree perineal laceration, with  delivery 11/27/2017   PROM (premature rupture of membranes) 11/26/2017   Generalized anxiety disorder 01/30/2017   Adderall use disorder, mild, abuse (HCC) 06/27/2016   Convulsions/seizures--[facility] 07/2015--etoh!! 08/01/2015   Alcohol use disorder, severe, in early remission, dependence (HCC) 08/01/2015   PMDD (premenstrual dysphoric disorder) 10/16/2013   Attention deficit hyperactivity disorder (ADHD), combined type, severe 05/08/2013   Depression, reactive 05/08/2013     Referrals to Alternative Service(s): Referred to Alternative Service(s):   Place:   Date:   Time:    Referred to Alternative Service(s):   Place:   Date:   Time:    Referred to Alternative Service(s):   Place:   Date:   Time:    Referred to Alternative Service(s):   Place:   Date:   Time:     Dava Najjar, MA,LCMHCA,NCC

## 2023-04-26 NOTE — Progress Notes (Signed)
   04/26/23 0019  [facility]UC Triage Screening (Walk-ins at [facility] only)  How Did You Hear About [place]? Family/Friend  What Is the Reason for Your Visit/Call Today? Pt presents to [facility] voluntarily, accompanied by her mother with complaint of suicidal ideations, with no plan or intent. Pt reports that she is highly stressed and that family issues are her immediate stressors. Pt reports having ongoing thoughts of suicide for a few days, but today she was afraid that she might go through with it. Pt has history of alcohol abuse and depression. Pt is unsure if she can contract for safety. Pt denies HI, AVH and substance/alcohol use.  How Long Has This Been Causing You Problems? <Week  Have You Recently Had Any Thoughts About Hurting Yourself? Yes  How long ago did you have thoughts about hurting yourself? currently  Are You Planning to Commit Suicide/Harm Yourself At This time? No  Have you Recently Had Thoughts About Hurting Someone Karolee Ohs? No  Are You Planning To Harm Someone At This Time? No  Are you currently experiencing any auditory, visual or other hallucinations? No (pt denies)  Have You Used Any Alcohol or Drugs in the Past 24 Hours? No  Do you have any current medical co-morbidities that require immediate attention? No (BP is elevated)  Clinician description of patient physical appearance/behavior: Pt is cooperative, pleasant. Casually dressed  What Do You Feel Would Help You the Most Today? Treatment for Depression or other mood problem  If access to [facility] was not available, would you have sought care in the Emergency Department? No  Determination of Need Urgent (48 hours)  Options For Referral Other: Comment;BH Urgent Care;Outpatient Therapy;Medication Management

## 2023-05-12 ENCOUNTER — Emergency Department (HOSPITAL_COMMUNITY)
Admission: EM | Admit: 2023-05-12 | Discharge: 2023-05-12 | Disposition: A | Discharge status: Home / Self Care | Payer: BLUE CROSS/BLUE SHIELD | Attending: Emergency Medicine | Admitting: Emergency Medicine

## 2023-05-12 DIAGNOSIS — R000 Tachycardia, unspecified: Secondary | ICD-10-CM | POA: Insufficient documentation

## 2023-05-12 DIAGNOSIS — M25512 Pain in left shoulder: Secondary | ICD-10-CM | POA: Insufficient documentation

## 2023-05-12 DIAGNOSIS — F1010 Alcohol abuse, uncomplicated: Secondary | ICD-10-CM | POA: Insufficient documentation

## 2023-05-12 DIAGNOSIS — Y908 Blood alcohol level of 240 mg/100 ml or more: Secondary | ICD-10-CM | POA: Diagnosis not present

## 2023-05-12 DIAGNOSIS — F10220 Alcohol dependence with intoxication, uncomplicated: Secondary | ICD-10-CM | POA: Diagnosis present

## 2023-05-12 DIAGNOSIS — F10920 Alcohol use, unspecified with intoxication, uncomplicated: Secondary | ICD-10-CM

## 2023-05-12 LAB — ACETAMINOPHEN LEVEL: Acetaminophen (Tylenol), Serum: <10 ug/mL — ABNORMAL LOW (ref 10–30)

## 2023-05-12 LAB — COMPREHENSIVE METABOLIC PANEL
ALT: 60 U/L — ABNORMAL HIGH (ref 0–44)
AST: 114 U/L — ABNORMAL HIGH (ref 15–41)
Albumin: 3.8 g/dL (ref 3.5–5.0)
Alkaline Phosphatase: 64 U/L (ref 38–126)
Anion gap: 13 (ref 5–15)
BUN: 12 mg/dL (ref 6–20)
CO2: 20 mmol/L — ABNORMAL LOW (ref 22–32)
Calcium: 8.2 mg/dL — ABNORMAL LOW (ref 8.9–10.3)
Chloride: 102 mmol/L (ref 98–111)
Creatinine, Ser: 0.64 mg/dL (ref 0.44–1.00)
GFR, Estimated: >60 mL/min (ref >60)
Glucose, Bld: 89 mg/dL (ref 70–99)
Potassium: 3.4 mmol/L — ABNORMAL LOW (ref 3.5–5.1)
Sodium: 135 mmol/L (ref 135–145)
Total Bilirubin: 0.3 mg/dL (ref 0.3–1.2)
Total Protein: 7.6 g/dL (ref 6.5–8.1)

## 2023-05-12 LAB — CBC WITH DIFFERENTIAL/PLATELET
Abs Immature Granulocytes: 0.02 10*3/uL (ref 0.00–0.07)
Basophils Absolute: 0.1 10*3/uL (ref 0.0–0.1)
Basophils Relative: 2 %
Eosinophils Absolute: 0.2 10*3/uL (ref 0.0–0.5)
Eosinophils Relative: 3 %
HCT: 39.4 % (ref 36.0–46.0)
Hemoglobin: 13.7 g/dL (ref 12.0–15.0)
Immature Granulocytes: 0 %
Lymphocytes Relative: 24 %
Lymphs Abs: 1.7 10*3/uL (ref 0.7–4.0)
MCH: 32.3 pg (ref 26.0–34.0)
MCHC: 34.8 g/dL (ref 30.0–36.0)
MCV: 92.9 fL (ref 80.0–100.0)
Monocytes Absolute: 0.7 10*3/uL (ref 0.1–1.0)
Monocytes Relative: 10 %
Neutro Abs: 4.2 10*3/uL (ref 1.7–7.7)
Neutrophils Relative %: 61 %
Platelets: 226 10*3/uL (ref 150–400)
RBC: 4.24 MIL/uL (ref 3.87–5.11)
RDW: 16.5 % — ABNORMAL HIGH (ref 11.5–15.5)
WBC: 6.9 10*3/uL (ref 4.0–10.5)
nRBC: 0 % (ref 0.0–0.2)

## 2023-05-12 LAB — SALICYLATE LEVEL: Salicylate Lvl: <7 mg/dL — ABNORMAL LOW (ref 7.0–30.0)

## 2023-05-12 LAB — LACTIC ACID, PLASMA: Lactic Acid, Venous: 2 mmol/L (ref 0.5–1.9)

## 2023-05-12 LAB — MAGNESIUM: Magnesium: 1.9 mg/dL (ref 1.7–2.4)

## 2023-05-12 LAB — ETHANOL: Alcohol, Ethyl (B): 397 mg/dL (ref <10)

## 2023-05-12 MED ORDER — LACTATED RINGERS IV BOLUS
1000.0000 mL | Freq: Once | INTRAVENOUS | Status: AC
  Administered 2023-05-12: 1000 mL via INTRAVENOUS

## 2023-05-12 NOTE — Discharge Instructions (Signed)
You were given resources that are available if you are interested in stopping drinking.  All of your lab work looks good and other than causing intoxication Listerine is otherwise fairly safe.  Return for any pain or excessive vomiting.

## 2023-05-12 NOTE — ED Notes (Signed)
Provider declined 2nd lactic collection.

## 2023-05-12 NOTE — ED Triage Notes (Signed)
Pt arrived via [facility] from home for ETOH, p advised EMS she has Ingested Listerine, and his suffering from depression, pt also has L shoulder pain.

## 2023-05-12 NOTE — ED Provider Notes (Signed)
[place] EMERGENCY DEPARTMENT AT [facility] Provider Note   CSN: [id number] Arrival date & time: 05/12/23  2028     History  Chief Complaint  Patient presents with   Alcohol Intoxication    Ingestion Listerine     Taylor [name] is a 42 y.o. female.  Like patient is a 42 year old female with a history of alcohol abuse depression, anxiety, anemia who is presenting today via EMS after being called because she had been drinking Listerine today in an attempt to become drunk.  Patient reports she typically binge drinks time about 4 times a week and then will go 3 days without drinking and drink a lot again.  She reports that she drinks a lot of vodka.  When she is not drinking she feels sweats, nausea, shakes.  She was chasing after her child yesterday and fell and hit her shoulder on the table.  She went to urgent care today and had x-rays of her shoulder which were normal but when her brother found out that she had been drinking Listerine he called EMS.  Patient states she was not trying to hurt herself she just wanted to get stronger.  She does see a psychiatrist but is changing her doctor..  The history is provided by the patient and the EMS personnel.  Alcohol Intoxication       Home Medications Prior to Admission medications   Medication Sig Start Date End Date Taking? Authorizing Provider  albuterol (VENTOLIN HFA) 108 (90 Base) MCG/ACT inhaler Inhale 2 puffs into the lungs every 6 (six) hours as needed for wheezing or shortness of breath.    [provider]  ALPRAZolam Prudy Feeler) 0.25 MG tablet Take 0.25-0.5 mg by mouth at bedtime as needed for sleep. 02/10/21   [provider]  amphetamine-dextroamphetamine (ADDERALL) 30 MG tablet Take 20 mg by mouth 2 (two) times daily. 01/29/21   [provider]  buPROPion (WELLBUTRIN XL) 300 MG 24 hr tablet Take 1 tablet (300 mg total) by mouth daily after breakfast. 06/13/19   Chauncey Mann, MD   drospirenone-ethinyl estradiol (YAZ) 3-0.02 MG tablet Take 1 tablet by mouth daily. Take daily for 3 packs skipping placebo tablets then skip for one week and repeat the process. 05/17/19   [provider]  EPINEPHrine 0.3 mg/0.3 mL IJ SOAJ injection Inject 0.3 mg into the muscle as needed for anaphylaxis. 01/03/23   [provider]  ondansetron (ZOFRAN-ODT) 4 MG disintegrating tablet Take 4 mg by mouth every 6 (six) hours as needed for nausea. 01/03/23   [provider]  polyvinyl alcohol (LIQUIFILM TEARS) 1.4 % ophthalmic solution Place 2 drops into both eyes as needed for dry eyes.    [provider]  SUMAtriptan (IMITREX) 100 MG tablet Take 100 mg by mouth every 2 (two) hours as needed (Take at the earliest sign of migraine). 01/03/23   [provider]      Allergies    Bee venom    Review of Systems   Review of Systems  Physical Exam Updated Vital Signs BP (!) 155/114   Pulse (!) 110   Resp (!) 22   Ht 5\' 2"  (1.575 m)   Wt 61.2 kg   LMP 12/19/2022 (Approximate)   SpO2 97%   BMI 24.69 kg/m  Physical Exam Vitals and nursing note reviewed.  Constitutional:      General: She is not in acute distress.    Appearance: She is well-developed.  HENT:  Head: Normocephalic and atraumatic.  Eyes:     Pupils: Pupils are equal, round, and reactive to light.  Cardiovascular:     Rate and Rhythm: Regular rhythm. Tachycardia present.     Heart sounds: Normal heart sounds. No murmur heard.    No friction rub.  Pulmonary:     Effort: Pulmonary effort is normal.     Breath sounds: Normal breath sounds. No wheezing or rales.  Abdominal:     General: Bowel sounds are normal. There is no distension.     Palpations: Abdomen is soft.     Tenderness: There is no abdominal tenderness. There is no guarding or rebound.  Musculoskeletal:        General: Tenderness present. Normal range of motion.     Left shoulder: Tenderness present. Normal range of  motion.     Comments: No edema  Skin:    General: Skin is warm and dry.     Findings: No rash.  Neurological:     Mental Status: She is alert and oriented to person, place, and time.     Sensory: No sensory deficit.     Motor: No weakness.  Psychiatric:        Speech: Speech normal.        Behavior: Behavior normal.        Thought Content: Thought content is not delusional. Thought content does not include homicidal or suicidal ideation. Thought content does not include homicidal or suicidal plan.     ED Results / Procedures / Treatments   Labs (all labs ordered are listed, but only abnormal results are displayed) Labs Reviewed  COMPREHENSIVE METABOLIC PANEL - Abnormal; Notable for the following components:      Result Value   Potassium 3.4 (*)    CO2 20 (*)    Calcium 8.2 (*)    AST 114 (*)    ALT 60 (*)    All other components within normal limits  CBC WITH DIFFERENTIAL/PLATELET - Abnormal; Notable for the following components:   RDW 16.5 (*)    All other components within normal limits  ETHANOL - Abnormal; Notable for the following components:   Alcohol, Ethyl (B) 397 (*)    All other components within normal limits  LACTIC ACID, PLASMA - Abnormal; Notable for the following components:   Lactic Acid, Venous 2.0 (*)    All other components within normal limits  SALICYLATE LEVEL - Abnormal; Notable for the following components:   Salicylate Lvl <7.0 (*)    All other components within normal limits  ACETAMINOPHEN LEVEL - Abnormal; Notable for the following components:   Acetaminophen (Tylenol), Serum <10 (*)    All other components within normal limits  MAGNESIUM  LACTIC ACID, PLASMA    EKG EKG Interpretation  Date/Time:  Friday May 12 2023 20:54:01 EDT Ventricular Rate:  106 PR Interval:  118 QRS Duration: 77 QT Interval:  323 QTC Calculation: 429 R Axis:   42 Text Interpretation: Sinus tachycardia No significant change since last tracing Confirmed by  Gwyneth Sprout ([id number]) on 05/12/2023 9:04:05 PM  Radiology No results found.  Procedures Procedures    Medications Ordered in ED Medications  lactated ringers bolus 1,000 mL (1,000 mLs Intravenous New Bag/Given 05/12/23 2105)    ED Course/ Medical Decision Making/ A&P                             Medical Decision Making Amount and/or Complexity  of Data Reviewed Independent Historian: EMS External Data Reviewed: notes. Labs: ordered. Decision-making details documented in ED Course. ECG/medicine tests: ordered and independent interpretation performed. Decision-making details documented in ED Course.   Pt with multiple medical problems and comorbidities and presenting today with a complaint that caries a high risk for morbidity and mortality.  Patient here today after drinking Listerine to get drunk.  Patient is tachycardic and hypertensive but is awake and alert. Patient has no SI or HI.  Discussed with poison control and they reported does have a high amount of alcohol but there are no other risk to drinking it other than nausea.  Will check patient's electrolytes as she is a heavy drinker and magnesium.  She was given fluids.  10:24 PM I independently interpreted patient's labs and CMP with normal creatinine sodium and potassium, LFTs are elevated with an AST of 114 and ALT of 60 and normal anion gap, CBC without acute findings.  EtOH level is 397 and lactic acid is 2.  Patient received IV fluids, salicylate and acetaminophen is negative.  I dependently interpreted patient's EKG which showed no acute findings other than sinus tachycardia.  On repeat evaluation she reports that she feels fine.  At this time do not feel that she needs further intervention.  She was given outpatient resources and she is attempting to call her ride who can come and pick her up.         Final Clinical Impression(s) / ED Diagnoses Final diagnoses:  ETOH abuse  Alcoholic intoxication without  complication [facility])    Rx / DC Orders ED Discharge Orders     None         Gwyneth Sprout, MD 05/12/23 2225

## 2023-05-12 NOTE — ED Notes (Signed)
"  Half of a big bottle of listerine"

## 2023-08-15 ENCOUNTER — Encounter: Payer: Self-pay | Admitting: Psychiatry

## 2023-08-15 ENCOUNTER — Ambulatory Visit: Payer: BLUE CROSS/BLUE SHIELD | Admitting: Psychiatry

## 2023-11-09 NOTE — Telephone Encounter (Signed)
Telephone call  

## 2024-01-12 IMAGING — CR DG CHEST 2V
2 series · 2 of 2 positions shown · non-contrast
Comparison: February 26, 2021

CLINICAL DATA: Shortness of breath.

EXAM:
CHEST - 2 VIEW

[chest pa]
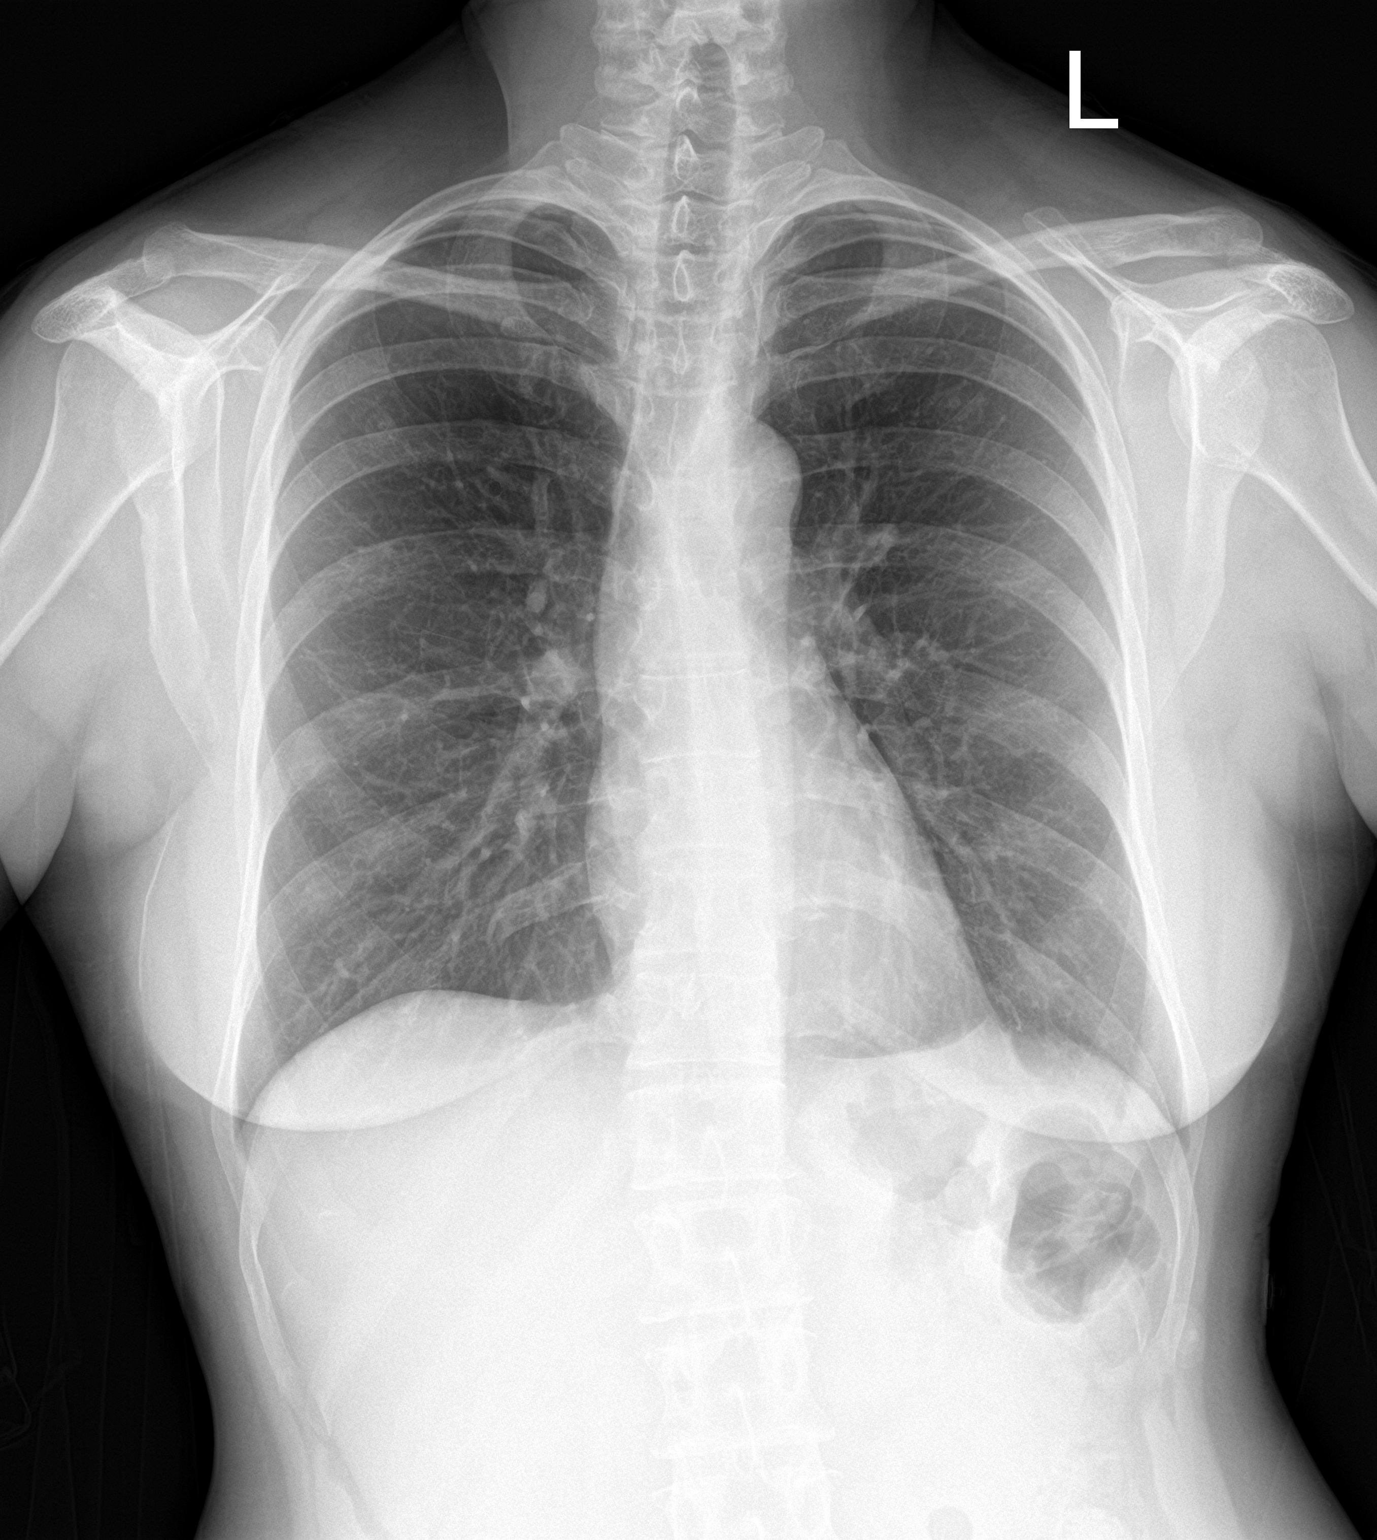

[chest lat]
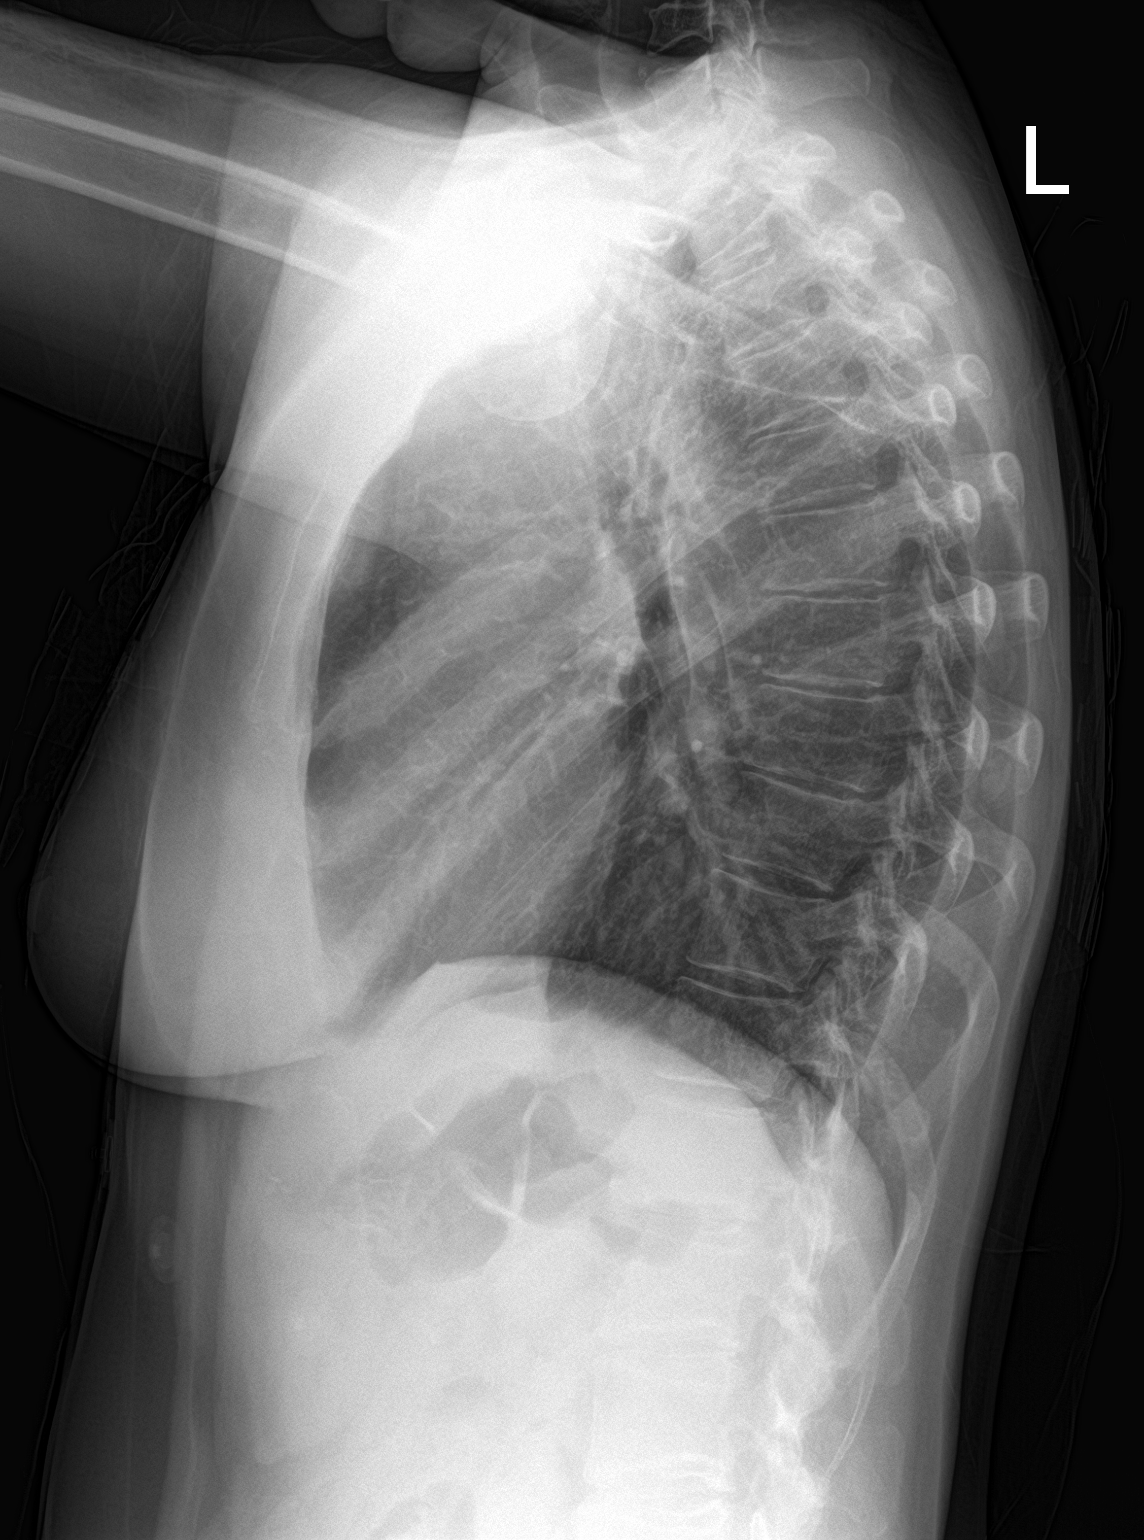

[2 of 2 positions shown; findings below may reference images not displayed]

FINDINGS: The heart size and mediastinal contours are within normal limits.
Both lungs are clear. The visualized skeletal structures are
unremarkable.
IMPRESSION: No active cardiopulmonary disease.
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
## 2024-08-28 NOTE — Telephone Encounter (Signed)
 Patient is returning missed call to clinic.   States that someone called her yesterday.   Advised that one of the prescriptions was called in on 08/26/24 and the other one is to be filled on 09/01/24

## 2024-09-27 NOTE — Telephone Encounter (Signed)
 Patient called to check the status of this refill request. She states she has 1 pill left that she will take today and then she is out of medication.

## 2024-09-28 ENCOUNTER — Emergency Department (HOSPITAL_BASED_OUTPATIENT_CLINIC_OR_DEPARTMENT_OTHER)

## 2024-09-28 ENCOUNTER — Other Ambulatory Visit: Payer: Self-pay

## 2024-09-28 ENCOUNTER — Encounter (HOSPITAL_BASED_OUTPATIENT_CLINIC_OR_DEPARTMENT_OTHER): Payer: Self-pay

## 2024-09-28 ENCOUNTER — Emergency Department (HOSPITAL_BASED_OUTPATIENT_CLINIC_OR_DEPARTMENT_OTHER)
Admission: EM | Admit: 2024-09-28 | Discharge: 2024-09-28 | Disposition: A | Discharge status: Home / Self Care | Attending: Emergency Medicine | Admitting: Emergency Medicine

## 2024-09-28 DIAGNOSIS — R112 Nausea with vomiting, unspecified: Secondary | ICD-10-CM | POA: Diagnosis not present

## 2024-09-28 DIAGNOSIS — F10920 Alcohol use, unspecified with intoxication, uncomplicated: Secondary | ICD-10-CM

## 2024-09-28 DIAGNOSIS — Y908 Blood alcohol level of 240 mg/100 ml or more: Secondary | ICD-10-CM | POA: Insufficient documentation

## 2024-09-28 DIAGNOSIS — F10120 Alcohol abuse with intoxication, uncomplicated: Secondary | ICD-10-CM | POA: Insufficient documentation

## 2024-09-28 LAB — CBC WITH DIFFERENTIAL/PLATELET
Abs Immature Granulocytes: 0.01 K/uL (ref 0.00–0.07)
Basophils Absolute: 0.1 K/uL (ref 0.0–0.1)
Basophils Relative: 3 %
Eosinophils Absolute: 0 K/uL (ref 0.0–0.5)
Eosinophils Relative: 0 %
HCT: 42.5 % (ref 36.0–46.0)
Hemoglobin: 15.6 g/dL — ABNORMAL HIGH (ref 12.0–15.0)
Immature Granulocytes: 0 %
Lymphocytes Relative: 46 %
Lymphs Abs: 1.2 K/uL (ref 0.7–4.0)
MCH: 35.3 pg — ABNORMAL HIGH (ref 26.0–34.0)
MCHC: 36.7 g/dL — ABNORMAL HIGH (ref 30.0–36.0)
MCV: 96.2 fL (ref 80.0–100.0)
Monocytes Absolute: 0.3 K/uL (ref 0.1–1.0)
Monocytes Relative: 11 %
Neutro Abs: 1.1 K/uL — ABNORMAL LOW (ref 1.7–7.7)
Neutrophils Relative %: 40 %
Platelets: 143 K/uL — ABNORMAL LOW (ref 150–400)
RBC: 4.42 MIL/uL (ref 3.87–5.11)
RDW: 14.3 % (ref 11.5–15.5)
WBC: 2.7 K/uL — ABNORMAL LOW (ref 4.0–10.5)
nRBC: 0 % (ref 0.0–0.2)

## 2024-09-28 LAB — URINE DRUG SCREEN
Amphetamines: NEGATIVE
Barbiturates: NEGATIVE
Benzodiazepines: NEGATIVE
Cocaine: NEGATIVE
Fentanyl: NEGATIVE
Methadone Scn, Ur: NEGATIVE
Opiates: NEGATIVE
Tetrahydrocannabinol: NEGATIVE

## 2024-09-28 LAB — ETHANOL: Alcohol, Ethyl (B): 432 mg/dL (ref <15)

## 2024-09-28 LAB — HEPATIC FUNCTION PANEL
ALT: 52 U/L — ABNORMAL HIGH (ref 0–44)
AST: 136 U/L — ABNORMAL HIGH (ref 15–41)
Albumin: 4.8 g/dL (ref 3.5–5.0)
Alkaline Phosphatase: 89 U/L (ref 38–126)
Bilirubin, Direct: 0.4 mg/dL — ABNORMAL HIGH (ref 0.0–0.2)
Indirect Bilirubin: 0.5 mg/dL (ref 0.3–0.9)
Total Bilirubin: 0.9 mg/dL (ref 0.0–1.2)
Total Protein: 8.3 g/dL — ABNORMAL HIGH (ref 6.5–8.1)

## 2024-09-28 LAB — LIPASE, BLOOD: Lipase: 33 U/L (ref 11–51)

## 2024-09-28 LAB — BASIC METABOLIC PANEL WITH GFR
Anion gap: 26 — ABNORMAL HIGH (ref 5–15)
BUN: 7 mg/dL (ref 6–20)
CO2: 19 mmol/L — ABNORMAL LOW (ref 22–32)
Calcium: 9.2 mg/dL (ref 8.9–10.3)
Chloride: 91 mmol/L — ABNORMAL LOW (ref 98–111)
Creatinine, Ser: 0.66 mg/dL (ref 0.44–1.00)
GFR, Estimated: >60 mL/min (ref >60)
Glucose, Bld: 97 mg/dL (ref 70–99)
Potassium: 4 mmol/L (ref 3.5–5.1)
Sodium: 136 mmol/L (ref 135–145)

## 2024-09-28 LAB — PREGNANCY, URINE: Preg Test, Ur: NEGATIVE

## 2024-09-28 MED ORDER — DIPHENHYDRAMINE HCL 50 MG/ML IJ SOLN
50.0000 mg | Freq: Once | INTRAMUSCULAR | Status: AC
  Administered 2024-09-28: 50 mg via INTRAVENOUS
  Filled 2024-09-28: qty 1

## 2024-09-28 MED ORDER — PHENOBARBITAL SODIUM 65 MG/ML IJ SOLN
65.0000 mg | Freq: Once | INTRAMUSCULAR | Status: AC
  Administered 2024-09-28: 65 mg via INTRAVENOUS

## 2024-09-28 MED ORDER — LACTATED RINGERS IV BOLUS
1000.0000 mL | Freq: Once | INTRAVENOUS | Status: AC
  Administered 2024-09-28: 1000 mL via INTRAVENOUS

## 2024-09-28 MED ORDER — DIAZEPAM 5 MG/ML IJ SOLN
5.0000 mg | Freq: Once | INTRAMUSCULAR | Status: AC
  Administered 2024-09-28: 5 mg via INTRAVENOUS
  Filled 2024-09-28: qty 2

## 2024-09-28 MED ORDER — PHENOBARBITAL SODIUM 130 MG/ML IJ SOLN
130.0000 mg | Freq: Once | INTRAMUSCULAR | Status: DC
  Filled 2024-09-28: qty 1

## 2024-09-28 MED ORDER — CHLORDIAZEPOXIDE HCL 25 MG PO CAPS: ORAL_CAPSULE | ORAL | 0 refills | Status: AC

## 2024-09-28 MED ORDER — PROCHLORPERAZINE EDISYLATE 10 MG/2ML IJ SOLN
10.0000 mg | Freq: Once | INTRAMUSCULAR | Status: AC
  Administered 2024-09-28: 10 mg via INTRAVENOUS
  Filled 2024-09-28: qty 2

## 2024-09-28 MED ORDER — THIAMINE HCL 100 MG/ML IJ SOLN
100.0000 mg | Freq: Once | INTRAMUSCULAR | Status: AC
  Administered 2024-09-28: 100 mg via INTRAVENOUS
  Filled 2024-09-28: qty 2

## 2024-09-28 NOTE — ED Provider Notes (Signed)
 Signout from Dr. Jerral.  43 year old female here with probable alcohol intoxication and subjective seizures which is shaking.  Complain of headache nausea vomiting.  Getting fluids and medications.  Plan is to reassess for likely discharge Physical Exam  BP (!) 168/115   Pulse (!) 115   Temp 97.7 F (36.5 C) (Oral)   Resp 20   LMP 07/04/2024 (Approximate)   SpO2 100%   Physical Exam  Procedures  Procedures  ED Course / MDM   Clinical Course as of 09/28/24 1624  Sat Sep 28, 2024  1541 Patient feeling much better.  Tachycardia still in the low 100s but improved with second liter IV fluids.  Does not want inpatient detox at this time.  Will discharge with Librium  taper instruction for outpatient follow-up.  She is currently attending AA meetings.  Will provide her with additional resources for outpatient alcohol counseling. [MP]    Clinical Course User Index [MP] Pamella Ozell LABOR, DO   Medical Decision Making Amount and/or Complexity of Data Reviewed Labs: ordered. Radiology: ordered.  Risk Prescription drug management.   11 AM.  Patient resting quietly.  Remains mildly tachycardic.  No distress.  Labs coming back with some mild abnormalities.  Alcohol level significantly elevated.  Will continue to monitor.  2 PM.  Patient wakes easily.  So she is feeling better although remains tachycardic.  She said she has all the resources for detox.  She said her father is coming back to pick her up.     Towana Ozell BROCKS, MD 09/28/24 [phone]

## 2024-09-28 NOTE — ED Provider Notes (Signed)
 [place] EMERGENCY DEPARTMENT AT [facility] Provider Note   CSN: [id number] Arrival date & time: 09/28/24  0331     History Chief Complaint  Patient presents with   Migraine   Emesis    HPI Taylor [name] is a 43 y.o. female presenting for chief complaint of acute ETOH intoxication. States that she drink 2.25L of ETOH. Denies SI/HI. Having substantial emesis episodes. Stated I am having seizures and then started shaking while also talking through the event telling about her family stressors.   Patient's recorded medical, surgical, social, medication list and allergies were reviewed in the Snapshot window as part of the initial history.   Review of Systems   Review of Systems  Constitutional:  Negative for chills and fever.  HENT:  Negative for ear pain and sore throat.   Eyes:  Negative for pain and visual disturbance.  Respiratory:  Negative for cough and shortness of breath.   Cardiovascular:  Negative for chest pain and palpitations.  Gastrointestinal:  Positive for nausea and vomiting. Negative for abdominal pain.  Genitourinary:  Negative for dysuria and hematuria.  Musculoskeletal:  Negative for arthralgias and back pain.  Skin:  Negative for color change and rash.  Neurological:  Positive for headaches. Negative for seizures and syncope.  All other systems reviewed and are negative.   Physical Exam Updated Vital Signs BP (!) 168/115   Pulse (!) 115   Temp 97.7 F (36.5 C) (Oral)   Resp 20   LMP 07/04/2024 (Approximate)   SpO2 100%  Physical Exam Vitals and nursing note reviewed.  Constitutional:      General: She is not in acute distress.    Appearance: She is well-developed.  HENT:     Head: Normocephalic and atraumatic.  Eyes:     Conjunctiva/sclera: Conjunctivae normal.  Cardiovascular:     Rate and Rhythm: Normal rate and regular rhythm.     Heart sounds: No murmur heard. Pulmonary:     Effort: Pulmonary effort is normal. No  respiratory distress.     Breath sounds: Normal breath sounds.  Abdominal:     General: There is no distension.     Palpations: Abdomen is soft.     Tenderness: There is no abdominal tenderness. There is no right CVA tenderness or left CVA tenderness.  Musculoskeletal:        General: No swelling or tenderness. Normal range of motion.     Cervical back: Neck supple.  Skin:    General: Skin is warm and dry.  Neurological:     General: No focal deficit present.     Mental Status: She is alert and oriented to person, place, and time. Mental status is at baseline.     Cranial Nerves: No cranial nerve deficit.      ED Course/ Medical Decision Making/ A&P    Procedures Procedures   Medications Ordered in ED Medications  lactated ringers  bolus 1,000 mL (1,000 mLs Intravenous New Bag/Given 09/28/24 0437)  diphenhydrAMINE  (BENADRYL ) injection 50 mg (50 mg Intravenous Given 09/28/24 0437)  prochlorperazine  (COMPAZINE ) injection 10 mg (10 mg Intravenous Given 09/28/24 0437)  diazepam (VALIUM) injection 5 mg (5 mg Intravenous Given 09/28/24 0546)    Medical Decision Making:   Taylor [name] is a 43 y.o. female who presented to the ED today with a myriad of symptoms detailed above.    Patient placed on continuous vitals and telemetry monitoring while in ED which was reviewed periodically.  Complete initial physical exam  performed, notably the patient  was HDS in NAD.    Reviewed and confirmed nursing documentation for past medical history, family history, social history.    Initial Assessment:   Patient presenting with headaches, nausea and vomitting and tremors after heavy ETOH use today.  1x episode of bloody emesis more consistent with MW tears given prodromal wretching.  Will get x-ray to evaluate for free air or other acute pathology.  No crepitus on exam. Patient's reported seizure events are more consistent with nonepileptic episodes.  She is talking during the episodes while  also shaking both upper extremities and then both lower extremities and then shaking her head rapidly back-and-forth all asynchronously.  They are not consistent with grand mal seizures nor focal seizures given the bilateral nature and maintenance of alertness during episodes. Patient's headaches also likely related to her heavy alcohol use, will treat with typical migraine medications as well as Valium to assist with patient's anxiety and plan for reassessment.  Headache already improving on first reassessment.  Reassessment: She is in no acute distress on my serial reassessments. Still profoundly intoxicated, will be observed by oncoming team until clinical sobriety.    Clinical Impression:  1. Alcoholic intoxication without complication      Data Unavailable   Final Clinical Impression(s) / ED Diagnoses Final diagnoses:  Alcoholic intoxication without complication    Rx / DC Orders ED Discharge Orders     None         Jerral Meth, MD 09/28/24 [phone]

## 2024-09-28 NOTE — Discharge Instructions (Addendum)
 You were seen in the emerged part for alcohol intoxication We observed you for over 12 hours and your symptoms improved We have called in a prescription for Librium  taper Be sure to follow the dosing instructions carefully to treat your alcohol withdrawal symptoms Do not drink alcohol or drive while taking Librium  Please see the resource guide for additional help with your drinking Return to the Emergency Department for worsening symptoms, seizures or any other concerns

## 2024-09-28 NOTE — ED Notes (Signed)
 Pt is asleep and resting. Pt states H/A and vomiting have resolved when asked. Pt went back to sleep.

## 2024-09-28 NOTE — ED Provider Notes (Signed)
  Physical Exam  BP (!) 140/91   Pulse (!) 104   Temp 98.2 F (36.8 C) (Oral)   Resp 16   LMP 07/04/2024 (Approximate)   SpO2 100%   Physical Exam Vitals and nursing note reviewed.  HENT:     Head: Normocephalic and atraumatic.  Eyes:     Pupils: Pupils are equal, round, and reactive to light.  Cardiovascular:     Rate and Rhythm: Regular rhythm. Tachycardia present.  Pulmonary:     Effort: Pulmonary effort is normal.     Breath sounds: Normal breath sounds.  Abdominal:     Palpations: Abdomen is soft.     Tenderness: There is no abdominal tenderness.  Skin:    General: Skin is warm and dry.  Neurological:     Mental Status: She is alert.  Psychiatric:        Mood and Affect: Mood normal.     Procedures  Procedures  ED Course / MDM   Clinical Course as of 09/28/24 1543  Sat Sep 28, 2024  1541 Patient feeling much better.  Tachycardia still in the low 100s but improved with second liter IV fluids.  Does not want inpatient detox at this time.  Will discharge with Librium  taper instruction for outpatient follow-up.  She is currently attending AA meetings.  Will provide her with additional resources for outpatient alcohol counseling. [MP]    Clinical Course User Index [MP] Pamella Ozell LABOR, DO   Medical Decision Making I, Ozell Pamella DO, have assumed care of this patient from the previous provider pending reevaluation.  Acute alcohol intoxication and disposition  Amount and/or Complexity of Data Reviewed Labs: ordered. Radiology: ordered.  Risk Prescription drug management.          Pamella Ozell LABOR, DO 09/28/24 [phone]

## 2024-09-28 NOTE — ED Triage Notes (Signed)
 Pt states that she has had headache and vomiting x 1 day. Pt states that she takes Imitrex  for cluster migraines and has taken 2 today with little relief. Pt does have photophobia, as well.

## 2024-11-26 ENCOUNTER — Emergency Department (HOSPITAL_BASED_OUTPATIENT_CLINIC_OR_DEPARTMENT_OTHER)
Admission: EM | Admit: 2024-11-26 | Discharge: 2024-11-26 | Disposition: A | Discharge status: Home / Self Care | Attending: Emergency Medicine | Admitting: Emergency Medicine

## 2024-11-26 DIAGNOSIS — F10920 Alcohol use, unspecified with intoxication, uncomplicated: Secondary | ICD-10-CM

## 2024-11-26 LAB — BASIC METABOLIC PANEL WITH GFR
Anion gap: 14 (ref 5–15)
BUN: 7 mg/dL (ref 6–20)
CO2: 25 mmol/L (ref 22–32)
Calcium: 8.6 mg/dL — ABNORMAL LOW (ref 8.9–10.3)
Chloride: 106 mmol/L (ref 98–111)
Creatinine, Ser: 0.52 mg/dL (ref 0.44–1.00)
GFR, Estimated: >60 mL/min (ref >60)
Glucose, Bld: 92 mg/dL (ref 70–99)
Potassium: 3.5 mmol/L (ref 3.5–5.1)
Sodium: 145 mmol/L (ref 135–145)

## 2024-11-26 LAB — CBC WITH DIFFERENTIAL/PLATELET
Abs Immature Granulocytes: 0.01 K/uL (ref 0.00–0.07)
Basophils Absolute: 0.1 K/uL (ref 0.0–0.1)
Basophils Relative: 3 %
Eosinophils Absolute: 0.1 K/uL (ref 0.0–0.5)
Eosinophils Relative: 2 %
HCT: 36.2 % (ref 36.0–46.0)
Hemoglobin: 12.6 g/dL (ref 12.0–15.0)
Immature Granulocytes: 0 %
Lymphocytes Relative: 43 %
Lymphs Abs: 1.4 K/uL (ref 0.7–4.0)
MCH: 33.6 pg (ref 26.0–34.0)
MCHC: 34.8 g/dL (ref 30.0–36.0)
MCV: 96.5 fL (ref 80.0–100.0)
Monocytes Absolute: 0.4 K/uL (ref 0.1–1.0)
Monocytes Relative: 12 %
Neutro Abs: 1.3 K/uL — ABNORMAL LOW (ref 1.7–7.7)
Neutrophils Relative %: 40 %
Platelets: 156 K/uL (ref 150–400)
RBC: 3.75 MIL/uL — ABNORMAL LOW (ref 3.87–5.11)
RDW: 13.8 % (ref 11.5–15.5)
WBC: 3.2 K/uL — ABNORMAL LOW (ref 4.0–10.5)
nRBC: 0 % (ref 0.0–0.2)

## 2024-11-26 NOTE — ED Notes (Signed)
 Pts phone found on the counter at the RN station (where [employer] was standing) was given to the Pt.

## 2024-11-26 NOTE — ED Notes (Signed)
 Patient verbalizes understanding of discharge instructions. Opportunity for questioning and answers were provided. Armband removed by staff, pt discharged from ED. Pt wheeled out to dad's car

## 2024-11-26 NOTE — ED Notes (Signed)
 Patient is aware of the need for a urine sample, but unable to provide one at this time.

## 2024-11-26 NOTE — ED Notes (Signed)
 The writer called pt's dad was called for ride home, states he will be here in about 15 min. Pt states she will be staying at her parent's house tonight.
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
## 2024-11-26 NOTE — ED Notes (Signed)
 Pt ambulated in hallway steadily with no problem, about 8ft.

## 2024-11-26 NOTE — Telephone Encounter (Signed)
 Patient updated.

## 2024-11-26 NOTE — Discharge Instructions (Addendum)
 Seen today for alcohol tox occasion.  Your workup was reassuring.  Follow-up with your primary care doctor, come back to the ER for any new or worsening symptoms.
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
